# Patient Record
Sex: Male | Born: 1990 | Race: White | Hispanic: No | Marital: Married | Smoking: Never smoker
Health system: Southern US, Community
[De-identification: ages and names within clinical notes are randomized; demographics above are authoritative.]

## PROBLEM LIST (undated history)

## (undated) DIAGNOSIS — M5126 Other intervertebral disc displacement, lumbar region: Secondary | ICD-10-CM

## (undated) HISTORY — PX: TONSILLECTOMY: SUR1361

---

## 2016-08-17 ENCOUNTER — Ambulatory Visit (HOSPITAL_COMMUNITY)
Admission: EM | Admit: 2016-08-17 | Discharge: 2016-08-17 | Disposition: A | Discharge status: Nursing Home (Includes ICF) | Payer: BLUE CROSS/BLUE SHIELD | Source: Home / Self Care | Attending: Family Medicine | Admitting: Family Medicine

## 2016-08-17 ENCOUNTER — Encounter (HOSPITAL_COMMUNITY): Payer: Self-pay | Admitting: Emergency Medicine

## 2016-08-17 ENCOUNTER — Emergency Department (HOSPITAL_COMMUNITY)
Admission: EM | Admit: 2016-08-17 | Discharge: 2016-08-17 | Disposition: A | Discharge status: Home / Self Care | Payer: BLUE CROSS/BLUE SHIELD | Attending: Emergency Medicine | Admitting: Emergency Medicine

## 2016-08-17 DIAGNOSIS — S39012A Strain of muscle, fascia and tendon of lower back, initial encounter: Secondary | ICD-10-CM | POA: Diagnosis not present

## 2016-08-17 DIAGNOSIS — M545 Low back pain: Secondary | ICD-10-CM | POA: Diagnosis present

## 2016-08-17 DIAGNOSIS — M5441 Lumbago with sciatica, right side: Secondary | ICD-10-CM | POA: Insufficient documentation

## 2016-08-17 MED ORDER — HYDROCODONE-ACETAMINOPHEN 5-325 MG PO TABS: 1.0000 | ORAL_TABLET | ORAL | 0 refills | Status: DC | PRN

## 2016-08-17 MED ORDER — ONDANSETRON 4 MG PO TBDP
ORAL_TABLET | ORAL | Status: AC
  Filled 2016-08-17: qty 2

## 2016-08-17 MED ORDER — ONDANSETRON 4 MG PO TBDP
8.0000 mg | ORAL_TABLET | Freq: Once | ORAL | Status: AC
  Administered 2016-08-17: 8 mg via ORAL

## 2016-08-17 MED ORDER — CYCLOBENZAPRINE HCL 5 MG PO TABS: 5.0000 mg | ORAL_TABLET | Freq: Three times a day (TID) | ORAL | 0 refills | Status: DC

## 2016-08-17 MED ORDER — HYDROMORPHONE HCL 1 MG/ML IJ SOLN
2.0000 mg | Freq: Once | INTRAMUSCULAR | Status: AC
  Administered 2016-08-17: 2 mg via INTRAMUSCULAR

## 2016-08-17 MED ORDER — KETOROLAC TROMETHAMINE 30 MG/ML IJ SOLN
30.0000 mg | Freq: Once | INTRAMUSCULAR | Status: AC
  Administered 2016-08-17: 30 mg via INTRAVENOUS
  Filled 2016-08-17: qty 1

## 2016-08-17 MED ORDER — NAPROXEN 500 MG PO TABS: 500.0000 mg | ORAL_TABLET | Freq: Two times a day (BID) | ORAL | 0 refills | Status: DC

## 2016-08-17 MED ORDER — HYDROMORPHONE HCL 1 MG/ML IJ SOLN
INTRAMUSCULAR | Status: AC
  Filled 2016-08-17: qty 2

## 2016-08-17 MED ORDER — METHOCARBAMOL 500 MG PO TABS: 500.0000 mg | ORAL_TABLET | Freq: Two times a day (BID) | ORAL | 0 refills | Status: DC | PRN

## 2016-08-17 NOTE — ED Notes (Signed)
Pt verbalized understanding discharge instructions and denies any further needs or questions at this time. VS stable, ambulatory and steady gait.   

## 2016-08-17 NOTE — ED Provider Notes (Signed)
[facility] Provider Note   CSN: [id number] Arrival date & time: 08/17/16  2151     History   Chief Complaint Chief Complaint  Patient presents with  . Back Pain    HPI Ryan Roberts is a 26 y.o. male.  Ryan Roberts is a 26 y.o. Male who presents to the emergency department complaining of right low back pain worsening today. 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 Patient tells me today his right low back pain has worsened. Patient tells me he bent over and had increase in his pain. This caused him to fall to his knees today. He denies hitting his back. He reports worsening pain with movement and had some pain radiating down his posterior right leg. He was seen in urgent care where he was given Dilaudid. He tells me after being provided with Dilaudid he felt flushed, sweaty and that his back muscles were in spasm. He was sent to the ER. Unsure if this was because they were worried about allergic reaction or because of his back pain. Patient is not sure. He denies any tongue swelling, lip swelling or trouble breathing. He denies any rashes or hives on his body. Currently he tells me his back pain is down to a 4 out of 10 and he no longer feels sweaty or flushed. He denies history of cancer or IV drug use. No rapid changes to his weight. He denies loss of bowel or bladder control. Denies fevers, numbness, weakness, abdominal pain, vomiting, diarrhea, rashes, chest pain, shortness of breath, trouble swallowing, or urinary symptoms.    The history is provided by the patient and medical records. No language interpreter was used.  Back Pain   Pertinent negatives include no chest pain, no fever, no numbness, no headaches, no abdominal pain, no dysuria and no weakness.    History reviewed. No pertinent past medical history.  There are no active problems to display for this patient.   Past Surgical History:    Procedure Laterality Date  . TONSILLECTOMY         Home Medications    Prior to Admission medications   Medication Sig Start Date End Date Taking? Authorizing Provider  HYDROcodone-acetaminophen (NORCO/VICODIN) 5-325 MG tablet Take 1 tablet by mouth every 4 (four) hours as needed. 08/17/16   Linna Hoff, MD  methocarbamol (ROBAXIN) 500 MG tablet Take 1 tablet (500 mg total) by mouth 2 (two) times daily as needed for muscle spasms. 08/17/16   Everlene Farrier, PA-C  naproxen (NAPROSYN) 500 MG tablet Take 1 tablet (500 mg total) by mouth 2 (two) times daily with a meal. 08/17/16   Everlene Farrier, PA-C    Family History No family history on file.  Social History Social History  Substance Use Topics  . Smoking status: Never Smoker  . Smokeless tobacco: Never Used  . Alcohol use No     Allergies   Patient has no known allergies.   Review of Systems Review of Systems  Constitutional: Negative for chills and fever.  HENT: Negative for congestion and sore throat.   Eyes: Negative for visual disturbance.  Respiratory: Negative for cough and shortness of breath.   Cardiovascular: Negative for chest pain.  Gastrointestinal: Negative for abdominal pain, diarrhea, nausea and vomiting.  Genitourinary: Negative for difficulty urinating, dysuria, flank pain, frequency, hematuria, penile pain, testicular pain and urgency.  Musculoskeletal: Positive for back pain. Negative for neck pain.  Skin: Negative for rash and wound.  Neurological: Negative for weakness, numbness and headaches.     Physical Exam Updated Vital Signs BP 133/79   Pulse 92   Temp 98.1 F (36.7 C) (Oral)   Ht 6\' 2"  (1.88 m)   Wt 111.1 kg   SpO2 95%   BMI 31.46 kg/m   Physical Exam  Constitutional: He is oriented to person, place, and time. He appears well-developed and well-nourished. No distress.  Nontoxic appearing.  HENT:  Head: Normocephalic and atraumatic.  Right Ear: External ear normal.  Left  Ear: External ear normal.  Mouth/Throat: Oropharynx is clear and moist.  No tongue or lip swelling. No drooling.  Eyes: Conjunctivae are normal. Pupils are equal, round, and reactive to light. Right eye exhibits no discharge. Left eye exhibits no discharge.  Neck: Neck supple.  Cardiovascular: Normal rate, regular rhythm, normal heart sounds and intact distal pulses.   Bilateral radial, and dorsalis pedis pulses are intact.    Pulmonary/Chest: Effort normal and breath sounds normal. No respiratory distress. He has no wheezes. He has no rales.  Abdominal: Soft. There is no tenderness. There is no guarding.  Musculoskeletal: Normal range of motion. He exhibits tenderness. He exhibits no edema or deformity.  Tenderness to his right low back musculature. No midline neck or back tenderness. No back erythema, deformity, ecchymosis or warmth. Good strength in his bilateral lower extremities.  Lymphadenopathy:    He has no cervical adenopathy.  Neurological: He is alert and oriented to person, place, and time. He displays normal reflexes. No sensory deficit. He exhibits normal muscle tone. Coordination normal.  Normal gait. Bilateral patellar DTRs are intact. Sensation is intact in his bilateral lower extremities.  Skin: Skin is warm and dry. Capillary refill takes less than 2 seconds. No rash noted. He is not diaphoretic. No erythema. No pallor.  Psychiatric: He has a normal mood and affect. His behavior is normal.  Nursing note and vitals reviewed.    ED Treatments / Results  Labs (all labs ordered are listed, but only abnormal results are displayed) Labs Reviewed - No data to display  EKG  EKG Interpretation None       Radiology No results found.  Procedures Procedures (including critical care time)  Medications Ordered in ED Medications  ketorolac (TORADOL) 30 MG/ML injection 30 mg (not administered)     Initial Impression / Assessment and Plan / ED Course  I have reviewed  the triage vital signs and the nursing notes.  Pertinent labs & imaging results that were available during my care of the patient were reviewed by me and considered in my medical decision making (see chart for details).    This  is a 26 y.o. Male who presents to the emergency department complaining of right low back pain worsening today. 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 Patient tells me today his right low back pain has worsened. Patient tells me he bent over and had increase in his pain. This caused him to fall to his knees today. He denies hitting his back. He reports worsening pain with movement and had some pain radiating down his posterior right leg. He was seen in urgent care where he was given Dilaudid. He tells me after being provided with Dilaudid he felt flushed, sweaty and that his back muscles were in spasm. He was sent to the ER. Unsure if this was because  they were worried about allergic reaction or because of his back pain. Patient is not sure. He denies any tongue swelling, lip swelling or trouble breathing. He denies any rashes or hives on his body. Currently he tells me his back pain is down to a 4 out of 10 and he no longer feels sweaty or flushed.  On exam the patient is afebrile nontoxic appearing. No neurological deficits and normal neuro exam.  Normal gait.  No loss of bowel or bladder control.  No concern for cauda equina.  No fever, night sweats, weight loss, h/o cancer, IVDU.  It sounds like patient had a side effect from the Dilaudid this evening. No evidence of allergic reaction. I offer the patient an x-ray of his back and he declines. He feels his back as a muscle spasm. Will provide him with Toradol and discharged with naproxen and Robaxin. I discussed back exercises. Discussed return precautions. I advised the patient to follow-up with their primary care provider this week. I advised the patient to return to  the emergency department with new or worsening symptoms or new concerns. The patient verbalized understanding and agreement with plan.     Final Clinical Impressions(s) / ED Diagnoses   Final diagnoses:  Acute right-sided low back pain with right-sided sciatica    New Prescriptions New Prescriptions   METHOCARBAMOL (ROBAXIN) 500 MG TABLET    Take 1 tablet (500 mg total) by mouth 2 (two) times daily as needed for muscle spasms.   NAPROXEN (NAPROSYN) 500 MG TABLET    Take 1 tablet (500 mg total) by mouth 2 (two) times daily with a meal.     Everlene FarrierWilliam Breyona Swander, PA-C 08/17/16 2325    Arby BarretteMarcy Pfeiffer, MD 08/21/16 [phone]

## 2016-08-17 NOTE — ED Provider Notes (Signed)
[facility]    CSN: [id number] Arrival date & time: 08/17/16  1805     History   Chief Complaint Chief Complaint  Patient presents with  . Back Pain    HPI Ryan Roberts is a 26 y.o. male.   The history is provided by the patient and the spouse.  Back Pain  Location:  Lumbar spine Quality:  Aching Pain severity:  Moderate Onset quality:  Sudden Timing:  Constant Progression:  Worsening Chronicity:  Chronic (chronic L4-5 pain with assoc disc disease, flare today with bending over to pick up wallet, unable to stand) Relieved by:  Nothing Worsened by:  Nothing Ineffective treatments:  None tried Associated symptoms: weakness   Associated symptoms: no abdominal pain, no abdominal swelling, no bladder incontinence, no bowel incontinence, no fever, no numbness, no pelvic pain, no perianal numbness and no tingling   Risk factors: lack of exercise     History reviewed. No pertinent past medical history.  There are no active problems to display for this patient.   Past Surgical History:  Procedure Laterality Date  . TONSILLECTOMY         Home Medications    Prior to Admission medications   Medication Sig Start Date End Date Taking? Authorizing Provider  HYDROcodone-acetaminophen (NORCO/VICODIN) 5-325 MG tablet Take 1 tablet by mouth every 4 (four) hours as needed. 08/17/16   Linna HoffJames D Megahn Killings, MD  methocarbamol (ROBAXIN) 500 MG tablet Take 1 tablet (500 mg total) by mouth 2 (two) times daily as needed for muscle spasms. 08/17/16   Everlene FarrierWilliam Dansie, PA-C  naproxen (NAPROSYN) 500 MG tablet Take 1 tablet (500 mg total) by mouth 2 (two) times daily with a meal. 08/17/16   Everlene FarrierWilliam Dansie, PA-C    Family History No family history on file.  Social History Social History  Substance Use Topics  . Smoking status: Never Smoker  . Smokeless tobacco: Never Used  . Alcohol use No     Allergies   Patient has no known allergies.   Review of Systems Review of  Systems  Constitutional: Negative for fever.  HENT: Negative.   Gastrointestinal: Negative.  Negative for abdominal pain and bowel incontinence.  Genitourinary: Negative.  Negative for bladder incontinence and pelvic pain.  Musculoskeletal: Positive for back pain and gait problem.  Neurological: Positive for weakness. Negative for tingling and numbness.  All other systems reviewed and are negative.    Physical Exam Triage Vital Signs ED Triage Vitals  Enc Vitals Group     BP 08/17/16 1846 130/82     Pulse Rate 08/17/16 1846 100     Resp 08/17/16 1846 16     Temp 08/17/16 1846 99.1 F (37.3 C)     Temp Source 08/17/16 1846 Oral     SpO2 08/17/16 1846 97 %     Weight 08/17/16 1847 245 lb (111.1 kg)     Height 08/17/16 1847 6\' 2"  (1.88 m)     Head Circumference --      Peak Flow --      Pain Score 08/17/16 1848 7     Pain Loc --      Pain Edu? --      Excl. in GC? --    No data found.   Updated Vital Signs BP 130/82   Pulse 100   Temp 99.1 F (37.3 C) (Oral)   Resp 16   Ht 6\' 2"  (1.88 m)   Wt 245 lb (111.1 kg)   SpO2 97%  BMI 31.46 kg/m   Visual Acuity Right Eye Distance:   Left Eye Distance:   Bilateral Distance:    Right Eye Near:   Left Eye Near:    Bilateral Near:     Physical Exam  Constitutional: He is oriented to person, place, and time. He appears well-developed and well-nourished. He appears distressed.  Musculoskeletal: He exhibits tenderness.       Lumbar back: He exhibits decreased range of motion, tenderness, bony tenderness, pain and spasm. He exhibits normal pulse.       Back:  Neurological: He is alert and oriented to person, place, and time.  Skin: Skin is warm and dry.  Nursing note and vitals reviewed.    UC Treatments / Results  Labs (all labs ordered are listed, but only abnormal results are displayed) Labs Reviewed - No data to display  EKG  EKG Interpretation None       Radiology No results  found.  Procedures Procedures (including critical care time)  Medications Ordered in UC Medications  HYDROmorphone (DILAUDID) injection 2 mg (2 mg Intramuscular Given 08/17/16 2108)  ondansetron (ZOFRAN-ODT) disintegrating tablet 8 mg (8 mg Oral Given 08/17/16 2108)     Initial Impression / Assessment and Plan / UC Course  I have reviewed the triage vital signs and the nursing notes.  Pertinent labs & imaging results that were available during my care of the patient were reviewed by me and considered in my medical decision making (see chart for details).       Final Clinical Impressions(s) / UC Diagnoses   Final diagnoses:  Strain of lumbar region, initial encounter    New Prescriptions Discharge Medication List as of 08/17/2016  9:05 PM    START taking these medications   Details  HYDROcodone-acetaminophen (NORCO/VICODIN) 5-325 MG tablet Take 1 tablet by mouth every 4 (four) hours as needed., Starting Sat 08/17/2016, Print    cyclobenzaprine (FLEXERIL) 5 MG tablet Take 1 tablet (5 mg total) by mouth 3 (three) times daily., Starting Sat 08/17/2016, Print         Linna Hoff, MD 08/20/16 1013

## 2016-08-17 NOTE — Discharge Instructions (Signed)
Bedrest, pain medicine as needed, see orthopedist next week

## 2016-08-17 NOTE — ED Triage Notes (Signed)
[facility] reports bending over this am to pick up something and reports lower back pain ever since. Hx herniated L4,L5. [facility] zofran PO, [facility] Pt became diaphoretic and pale after receiving Dilaudid. Pain currently 4/10.

## 2016-08-17 NOTE — ED Notes (Signed)
Pt had some diaphoresis and dizziness after pain medication. Pale, and BP dropped. IV started and pt sent to ED for further eval.

## 2016-08-17 NOTE — ED Triage Notes (Signed)
PT has known herniated L4 and L5 disks. PT began having shooting pain down left leg today at 11. PT fell in the shower at 1pm. PT reports no additional injury from fall.

## 2017-12-24 DIAGNOSIS — H81399 Other peripheral vertigo, unspecified ear: Secondary | ICD-10-CM | POA: Diagnosis not present

## 2017-12-24 DIAGNOSIS — M545 Low back pain: Secondary | ICD-10-CM | POA: Diagnosis not present

## 2018-02-02 DIAGNOSIS — S0501XA Injury of conjunctiva and corneal abrasion without foreign body, right eye, initial encounter: Secondary | ICD-10-CM | POA: Diagnosis not present

## 2018-02-05 DIAGNOSIS — S0501XD Injury of conjunctiva and corneal abrasion without foreign body, right eye, subsequent encounter: Secondary | ICD-10-CM | POA: Diagnosis not present

## 2018-04-16 DIAGNOSIS — M5126 Other intervertebral disc displacement, lumbar region: Secondary | ICD-10-CM | POA: Diagnosis not present

## 2018-04-27 DIAGNOSIS — M5126 Other intervertebral disc displacement, lumbar region: Secondary | ICD-10-CM | POA: Diagnosis not present

## 2018-05-07 DIAGNOSIS — M5126 Other intervertebral disc displacement, lumbar region: Secondary | ICD-10-CM | POA: Diagnosis not present

## 2018-05-19 DIAGNOSIS — M5416 Radiculopathy, lumbar region: Secondary | ICD-10-CM | POA: Diagnosis not present

## 2018-05-22 ENCOUNTER — Emergency Department: Payer: BLUE CROSS/BLUE SHIELD

## 2018-05-22 ENCOUNTER — Encounter: Payer: Self-pay | Admitting: Emergency Medicine

## 2018-05-22 ENCOUNTER — Emergency Department
Admission: EM | Admit: 2018-05-22 | Discharge: 2018-05-22 | Disposition: A | Discharge status: Home / Self Care | Payer: BLUE CROSS/BLUE SHIELD | Attending: Emergency Medicine | Admitting: Emergency Medicine

## 2018-05-22 ENCOUNTER — Other Ambulatory Visit: Payer: Self-pay

## 2018-05-22 DIAGNOSIS — M7918 Myalgia, other site: Secondary | ICD-10-CM

## 2018-05-22 DIAGNOSIS — M542 Cervicalgia: Secondary | ICD-10-CM | POA: Diagnosis not present

## 2018-05-22 DIAGNOSIS — S199XXA Unspecified injury of neck, initial encounter: Secondary | ICD-10-CM | POA: Diagnosis not present

## 2018-05-22 DIAGNOSIS — S3992XA Unspecified injury of lower back, initial encounter: Secondary | ICD-10-CM | POA: Diagnosis not present

## 2018-05-22 DIAGNOSIS — S40022A Contusion of left upper arm, initial encounter: Secondary | ICD-10-CM | POA: Diagnosis not present

## 2018-05-22 DIAGNOSIS — G44309 Post-traumatic headache, unspecified, not intractable: Secondary | ICD-10-CM | POA: Diagnosis not present

## 2018-05-22 DIAGNOSIS — M549 Dorsalgia, unspecified: Secondary | ICD-10-CM | POA: Diagnosis not present

## 2018-05-22 DIAGNOSIS — R51 Headache: Secondary | ICD-10-CM | POA: Insufficient documentation

## 2018-05-22 DIAGNOSIS — Z79899 Other long term (current) drug therapy: Secondary | ICD-10-CM | POA: Diagnosis not present

## 2018-05-22 DIAGNOSIS — S0990XA Unspecified injury of head, initial encounter: Secondary | ICD-10-CM | POA: Diagnosis not present

## 2018-05-22 HISTORY — DX: Other intervertebral disc displacement, lumbar region: M51.26

## 2018-05-22 MED ORDER — KETOROLAC TROMETHAMINE 10 MG PO TABS: 10.0000 mg | ORAL_TABLET | Freq: Four times a day (QID) | ORAL | 0 refills | Status: DC | PRN

## 2018-05-22 MED ORDER — KETOROLAC TROMETHAMINE 60 MG/2ML IM SOLN
60.0000 mg | Freq: Once | INTRAMUSCULAR | Status: AC
  Administered 2018-05-22: 60 mg via INTRAMUSCULAR
  Filled 2018-05-22: qty 2

## 2018-05-22 MED ORDER — CYCLOBENZAPRINE HCL 10 MG PO TABS: 10.0000 mg | ORAL_TABLET | Freq: Three times a day (TID) | ORAL | 0 refills | Status: DC | PRN

## 2018-05-22 MED ORDER — CYCLOBENZAPRINE HCL 10 MG PO TABS: 10.0000 mg | ORAL_TABLET | Freq: Once | ORAL | Status: DC

## 2018-05-22 MED ORDER — ONDANSETRON 8 MG PO TBDP
8.0000 mg | ORAL_TABLET | Freq: Once | ORAL | Status: AC
  Administered 2018-05-22: 8 mg via ORAL
  Filled 2018-05-22: qty 1
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
## 2018-05-22 NOTE — Discharge Instructions (Signed)
Follow discharge care instruction take medication as directed. °
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
## 2018-05-22 NOTE — ED Notes (Signed)
c-collar placed in triage

## 2018-06-12 DIAGNOSIS — M5416 Radiculopathy, lumbar region: Secondary | ICD-10-CM | POA: Diagnosis not present

## 2018-08-18 DIAGNOSIS — M5416 Radiculopathy, lumbar region: Secondary | ICD-10-CM | POA: Diagnosis not present

## 2018-08-28 DIAGNOSIS — J111 Influenza due to unidentified influenza virus with other respiratory manifestations: Secondary | ICD-10-CM | POA: Diagnosis not present

## 2019-03-30 DIAGNOSIS — M9905 Segmental and somatic dysfunction of pelvic region: Secondary | ICD-10-CM | POA: Diagnosis not present

## 2019-03-30 DIAGNOSIS — M62830 Muscle spasm of back: Secondary | ICD-10-CM | POA: Diagnosis not present

## 2019-03-30 DIAGNOSIS — M5136 Other intervertebral disc degeneration, lumbar region: Secondary | ICD-10-CM | POA: Diagnosis not present

## 2019-03-30 DIAGNOSIS — M9903 Segmental and somatic dysfunction of lumbar region: Secondary | ICD-10-CM | POA: Diagnosis not present

## 2019-04-12 DIAGNOSIS — M5416 Radiculopathy, lumbar region: Secondary | ICD-10-CM | POA: Diagnosis not present

## 2019-04-23 DIAGNOSIS — M5416 Radiculopathy, lumbar region: Secondary | ICD-10-CM | POA: Diagnosis not present
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
## 2019-09-24 ENCOUNTER — Other Ambulatory Visit: Payer: Self-pay

## 2019-09-24 ENCOUNTER — Ambulatory Visit: Payer: BC Managed Care – PPO | Admitting: Internal Medicine

## 2019-09-24 ENCOUNTER — Encounter: Payer: Self-pay | Admitting: Internal Medicine

## 2019-09-24 VITALS — BP 116/80 | HR 85 | Temp 97.1°F | Resp 16 | Ht 73.0 in | Wt 229.5 lb

## 2019-09-24 DIAGNOSIS — E6609 Other obesity due to excess calories: Secondary | ICD-10-CM

## 2019-09-24 DIAGNOSIS — Z13220 Encounter for screening for lipoid disorders: Secondary | ICD-10-CM

## 2019-09-24 DIAGNOSIS — F341 Dysthymic disorder: Secondary | ICD-10-CM | POA: Insufficient documentation

## 2019-09-24 DIAGNOSIS — Z6830 Body mass index (BMI) 30.0-30.9, adult: Secondary | ICD-10-CM

## 2019-09-24 DIAGNOSIS — L989 Disorder of the skin and subcutaneous tissue, unspecified: Secondary | ICD-10-CM | POA: Diagnosis not present

## 2019-09-24 DIAGNOSIS — R41840 Attention and concentration deficit: Secondary | ICD-10-CM

## 2019-09-24 DIAGNOSIS — M519 Unspecified thoracic, thoracolumbar and lumbosacral intervertebral disc disorder: Secondary | ICD-10-CM | POA: Diagnosis not present

## 2019-09-24 DIAGNOSIS — Z791 Long term (current) use of non-steroidal anti-inflammatories (NSAID): Secondary | ICD-10-CM

## 2019-09-24 DIAGNOSIS — Z5181 Encounter for therapeutic drug level monitoring: Secondary | ICD-10-CM | POA: Insufficient documentation

## 2019-09-24 MED ORDER — ATOMOXETINE HCL 40 MG PO CAPS: 40.0000 mg | ORAL_CAPSULE | Freq: Every day | ORAL | 2 refills | Status: DC

## 2019-09-25 LAB — COMPLETE METABOLIC PANEL WITH GFR
AG Ratio: 1.9 (calc) (ref 1.0–2.5)
ALT: 22 U/L (ref 9–46)
AST: 19 U/L (ref 10–40)
Albumin: 4.6 g/dL (ref 3.6–5.1)
Alkaline phosphatase (APISO): 63 U/L (ref 36–130)
BUN: 20 mg/dL (ref 7–25)
CO2: 28 mmol/L (ref 20–32)
Calcium: 9.8 mg/dL (ref 8.6–10.3)
Chloride: 106 mmol/L (ref 98–110)
Creat: 1.02 mg/dL (ref 0.60–1.35)
GFR, Est African American: 114 mL/min/{1.73_m2} (ref >=60)
GFR, Est Non African American: 98 mL/min/{1.73_m2} (ref >=60)
Globulin: 2.4 g/dL (calc) (ref 1.9–3.7)
Glucose, Bld: 90 mg/dL (ref 65–99)
Potassium: 4.7 mmol/L (ref 3.5–5.3)
Sodium: 140 mmol/L (ref 135–146)
Total Bilirubin: 0.7 mg/dL (ref 0.2–1.2)
Total Protein: 7 g/dL (ref 6.1–8.1)

## 2019-09-25 LAB — CBC WITH DIFFERENTIAL/PLATELET
Absolute Monocytes: 536 cells/uL (ref 200–950)
Basophils Absolute: 31 cells/uL (ref 0–200)
Basophils Relative: 0.6 %
Eosinophils Absolute: 130 cells/uL (ref 15–500)
Eosinophils Relative: 2.5 %
HCT: 47.8 % (ref 38.5–50.0)
Hemoglobin: 16.3 g/dL (ref 13.2–17.1)
Lymphs Abs: 1591 cells/uL (ref 850–3900)
MCH: 31 pg (ref 27.0–33.0)
MCHC: 34.1 g/dL (ref 32.0–36.0)
MCV: 90.9 fL (ref 80.0–100.0)
MPV: 11.5 fL (ref 7.5–12.5)
Monocytes Relative: 10.3 %
Neutro Abs: 2912 cells/uL (ref 1500–7800)
Neutrophils Relative %: 56 %
Platelets: 234 10*3/uL (ref 140–400)
RBC: 5.26 10*6/uL (ref 4.20–5.80)
RDW: 11.8 % (ref 11.0–15.0)
Total Lymphocyte: 30.6 %
WBC: 5.2 10*3/uL (ref 3.8–10.8)

## 2019-09-25 LAB — LIPID PANEL
Cholesterol: 194 mg/dL (ref <200)
HDL: 46 mg/dL (ref >=40)
LDL Cholesterol (Calc): 121 mg/dL (calc) — ABNORMAL HIGH
Non-HDL Cholesterol (Calc): 148 mg/dL (calc) — ABNORMAL HIGH (ref <130)
Total CHOL/HDL Ratio: 4.2 (calc) (ref <5.0)
Triglycerides: 159 mg/dL — ABNORMAL HIGH (ref <150)

## 2019-09-25 LAB — TSH: TSH: 1.95 mIU/L (ref 0.40–4.50)

## 2019-09-27 ENCOUNTER — Telehealth: Payer: Self-pay

## 2019-09-27 NOTE — Telephone Encounter (Signed)
Informed patient that we did not contact him, I did let him know that I saw a note in Dr. Dorris Fetch recent labs that stated that a CMA from his office tried reaching out to patient. Told patient to contact the internal medicine office. KW

## 2019-09-27 NOTE — Telephone Encounter (Signed)
Could this be one of the patients that has the insurance assigned to the wrong provider?   Copied from CRM [id number]. Topic: General - Call Back - No Documentation >> Sep 27, 2019  9:51 AM Baldo Daub L wrote: Reason for CRM:  Pt states that he received a call from the office, but no message was left.  Pt calling to see what is going on.

## 2019-11-18 NOTE — Progress Notes (Signed)
Patient ID: Ryan Roberts, male    DOB: [birth year], 29 y.o.   MRN: [id number]  PCP: Towanda Malkin, MD  Chief Complaint  Patient presents with  . Follow-up  . ADHD    Subjective:   Ryan Roberts is a 29 y.o. male, presents to clinic with CC of the following:  Chief Complaint  Patient presents with  . Follow-up  . ADHD    HPI:  Patient is a 29 year old male who presented to establish care approximately 8 weeks ago. He returns today for follow-up   Obesity  He has done extremely well losing weight in the past couple months, as after he got worried that his lipid panel was abnormal, that served as increased motivation.  His wife and him researched recipes, and I really watched her fats in her diet, and with continued regular exercise, he has been very successful in losing weight. He inquired about a number for a reasonable weight goal, and agreed it is about where he is at presently, with a 200 to 210 pound range felt reasonable. Wt Readings from Last 3 Encounters:  11/19/19 210 lb (95.3 kg)  09/24/19 229 lb 8 oz (104.1 kg)  05/22/18 185 lb (83.9 kg)   The patient noted last visit that his highest weight was approximately 250.  Hyperlipidemia Lab Results  Component Value Date   CHOL 194 09/24/2019   HDL 46 09/24/2019   LDLCALC 121 (H) 09/24/2019   TRIG 159 (H) 09/24/2019   CHOLHDL 4.2 09/24/2019   He has done a great job modifying his diet recently and discussed can recheck again in approximately 6 months on follow-up, and no need to discuss any medication addition at this point.  Attention deficit Clinically, did have elements of ADHD concern at last visit and discussed at length the need for testing to help diagnose, and the stimulant medicines that are often used for this and the concerns with these medicines for side effects, and also being tightly regulated as our controlled substances.  He was very understanding of this, and was not anxious to  try stimulant medicines. Noted the medicine Strattera which is often tried, a nonstimulant medicine and he was in agreement with trying this presently. Medication regimen - Strattera 40 mg daily, and he did not increase it to 80 mg daily as noted he could after about 4 weeks if felt it was not helping as much. 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   Lumbar disc disease In the past has followed with Dr. Wynn Banker, and he has had periodic injections when needed to help. Also manages by taking a couple of Aleve a day as needed Discussed with his chronic NSAID use, the importance of checking kidney function periodically, and did last visit and was okay. Lab Results  Component Value Date   CREATININE 1.02 09/24/2019   BUN 20 09/24/2019   NA 140 09/24/2019   K 4.7 09/24/2019   CL 106 09/24/2019   CO2 28 09/24/2019    Dysthymia reviewed his PHQ-9 again today.  He notes he is feeling much better with respect to any depression concerns, thanks a lot is related to being much more productive and better focused.  Covid vaccine -has not yet obtained,  although [employer] is mandating their students get the vaccine and he will.  Patient Active Problem List   Diagnosis Date Noted  . Class 1 obesity due to excess calories without serious comorbidity with body mass index (BMI) of 30.0 to 30.9 in adult 09/24/2019  . Attention deficit 09/24/2019  . Lumbar disc disease 09/24/2019  . Encounter for monitoring chronic NSAID therapy 09/24/2019  . Dysthymia 09/24/2019      Current Outpatient Medications:  .  atomoxetine (STRATTERA) 40 MG capsule, Take 1 capsule (40 mg total) by mouth daily., Disp: 30 capsule, Rfl: 2 .  naproxen sodium (ALEVE) 220 MG tablet, Take 220 mg by mouth as needed., Disp: , Rfl:     No Known Allergies   Past Surgical History:  Procedure Laterality Date  . TONSILLECTOMY       Family History  Problem Relation Age of Onset  . Diabetes Mother   . Cancer Father   . ADD / ADHD Father   . Colon cancer Maternal Grandmother   . Heart attack Maternal Grandfather   . Diabetes Maternal Grandfather   . Cancer Paternal Grandmother   . Lung cancer Paternal Grandfather      Social History   Tobacco Use  . Smoking status: Never Smoker  . Smokeless tobacco: Never Used  Substance Use Topics  . Alcohol use: No    With staff assistance, above reviewed with the patient today.  ROS: As per HPI, otherwise no specific complaints on a limited and focused system review   No results found for this or any previous visit (from the past 72 hour(s)).   PHQ2/9: Depression screen [facility] 2/9 11/19/2019 09/24/2019  Decreased Interest 0 0  Down, Depressed, Hopeless 0 1  PHQ - 2 Score 0 1  Altered sleeping 0 1  Tired, decreased energy 0 0  Change in appetite 0 0  Feeling bad or failure about yourself  0 0  Trouble concentrating 1 3  Moving slowly or fidgety/restless 0 1  Suicidal thoughts 0 -  PHQ-9 Score 1 6  Difficult doing work/chores Not difficult at all Very difficult   PHQ-2/9 Result is neg  Fall Risk: Fall Risk  11/19/2019 09/24/2019  Falls in the past year? 0 0  Number falls in past yr: 0 0  Injury with Fall? 0 0      Objective:   Vitals:   11/19/19 0902  BP: 130/72  Pulse: 96  Resp: 16  Temp: 98.1 F (36.7 C)  TempSrc: Temporal  SpO2: 100%  Weight: 210 lb (95.3 kg)  Height: 6\' 1"  (1.854 m)    Body mass index is 27.71 kg/m.  Physical Exam   NAD, masked, very pleasant, looks well HEENT - [place]/AT, sclera anicteric, PERRL,  conj - non-inj'ed,  pharynx clear Neck - supple, no adenopathy,  Car - RRR without m/g/r Pulm- RR and effort normal at rest, CTA without wheeze or rales Abd - soft, NT diffusely, Back - no CVA tenderness Ext - no LE edema,   Neuro/psychiatric - affect was not flat, appropriate with conversation  Alert and oriented  Grossly non-focal   Speech and gait are normal   Results for orders placed or performed in visit on 09/24/19  COMPLETE METABOLIC PANEL WITH GFR  Result Value Ref Range   Glucose, Bld 90 65 - 99 mg/dL   BUN 20 7 - 25 mg/dL   Creat 09/26/19 8.84 - 1.66 mg/dL   GFR, Est Non African American 98 > OR = 60  mL/min/1.37m2   GFR, Est African American 114 > OR = 60 mL/min/1.50m2   BUN/Creatinine Ratio NOT APPLICABLE 6 - 22 (calc)   Sodium 140 135 - 146 mmol/L   Potassium 4.7 3.5 - 5.3 mmol/L   Chloride 106 98 - 110 mmol/L   CO2 28 20 - 32 mmol/L   Calcium 9.8 8.6 - 10.3 mg/dL   Total Protein 7.0 6.1 - 8.1 g/dL   Albumin 4.6 3.6 - 5.1 g/dL   Globulin 2.4 1.9 - 3.7 g/dL (calc)   AG Ratio 1.9 1.0 - 2.5 (calc)   Total Bilirubin 0.7 0.2 - 1.2 mg/dL   Alkaline phosphatase (APISO) 63 36 - 130 U/L   AST 19 10 - 40 U/L   ALT 22 9 - 46 U/L  CBC with Differential/Platelet  Result Value Ref Range   WBC 5.2 3.8 - 10.8 Thousand/uL   RBC 5.26 4.20 - 5.80 Million/uL   Hemoglobin 16.3 13.2 - 17.1 g/dL   HCT 98.3 38.2 - 50.5 %   MCV 90.9 80.0 - 100.0 fL   MCH 31.0 27.0 - 33.0 pg   MCHC 34.1 32.0 - 36.0 g/dL   RDW 39.7 67.3 - 41.9 %   Platelets 234 140 - 400 Thousand/uL   MPV 11.5 7.5 - 12.5 fL   Neutro Abs 2,912 1,500 - 7,800 cells/uL   Lymphs Abs 1,591 850 - 3,900 cells/uL   Absolute Monocytes 536 200 - 950 cells/uL   Eosinophils Absolute 130 15 - 500 cells/uL   Basophils Absolute 31 0 - 200 cells/uL   Neutrophils Relative % 56 %   Total Lymphocyte 30.6 %   Monocytes Relative 10.3 %   Eosinophils Relative 2.5 %   Basophils Relative 0.6 %  TSH  Result Value Ref Range   TSH 1.95 0.40 - 4.50 mIU/L  Lipid panel  Result Value Ref Range   Cholesterol 194 <200 mg/dL   HDL 46 > OR = 40 mg/dL   Triglycerides 379 (H) <150 mg/dL   LDL Cholesterol (Calc) 121 (H) mg/dL (calc)   Total CHOL/HDL Ratio 4.2 <5.0  (calc)   Non-HDL Cholesterol (Calc) 148 (H) <130 mg/dL (calc)       Assessment & Plan:   1. Attention deficit Has done very well with the addition of Strattera. Will increase the dose to 80 mg as he would like to do so, and refilled the medicine today with the 80 mg capsule to just take 1 daily in the future. Continue to monitor for side effects, and he noted he will. - atomoxetine (STRATTERA) 80 MG capsule; Take 1 capsule (80 mg total) by mouth daily.  Dispense: 90 capsule; Refill: 1  2. Mixed hyperlipidemia Has changed his diet significantly to help with management, and also has helped him lose  weight as a result. Can recheck labs again in about 6 months to reassess.  3. Overweight Has had success with weight loss, and congratulated on that.  Continuing with the dietary modifications and staying active with regular exercise as he does is important for weight management, and continue to monitor.  4. Lumbar disc disease 5. Encounter for monitoring chronic NSAID therapy On last labs, kidney function was good Continuing with regular exercise also important to help with his back and he plans to continue with that  6. Dysthymia Symptoms much improved on this visit, with no real concerns for depression or dysthymia presently    Will schedule a follow-up in about 6 months time, follow-up sooner as needed.  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   Jamelle Haring, MD 11/19/19 9:22 AM

## 2019-11-19 ENCOUNTER — Ambulatory Visit: Payer: BC Managed Care – PPO | Admitting: Internal Medicine

## 2019-11-19 ENCOUNTER — Other Ambulatory Visit: Payer: Self-pay

## 2019-11-19 ENCOUNTER — Encounter: Payer: Self-pay | Admitting: Internal Medicine

## 2019-11-19 VITALS — BP 130/72 | HR 96 | Temp 98.1°F | Resp 16 | Ht 73.0 in | Wt 210.0 lb

## 2019-11-19 DIAGNOSIS — R41840 Attention and concentration deficit: Secondary | ICD-10-CM

## 2019-11-19 DIAGNOSIS — E782 Mixed hyperlipidemia: Secondary | ICD-10-CM | POA: Diagnosis not present

## 2019-11-19 DIAGNOSIS — Z5181 Encounter for therapeutic drug level monitoring: Secondary | ICD-10-CM

## 2019-11-19 DIAGNOSIS — Z791 Long term (current) use of non-steroidal anti-inflammatories (NSAID): Secondary | ICD-10-CM

## 2019-11-19 DIAGNOSIS — M519 Unspecified thoracic, thoracolumbar and lumbosacral intervertebral disc disorder: Secondary | ICD-10-CM

## 2019-11-19 DIAGNOSIS — E663 Overweight: Secondary | ICD-10-CM | POA: Diagnosis not present

## 2019-11-19 DIAGNOSIS — F341 Dysthymic disorder: Secondary | ICD-10-CM

## 2019-11-19 MED ORDER — ATOMOXETINE HCL 80 MG PO CAPS
80.0000 mg | ORAL_CAPSULE | Freq: Every day | ORAL | 1 refills | Status: DC
Start: 2019-11-19 — End: 2020-05-16

## 2019-11-19 NOTE — Patient Instructions (Signed)

## 2020-05-16 ENCOUNTER — Other Ambulatory Visit: Payer: Self-pay | Admitting: Internal Medicine

## 2020-05-16 DIAGNOSIS — R41840 Attention and concentration deficit: Secondary | ICD-10-CM

## 2020-05-16 NOTE — Progress Notes (Signed)
Patient ID: Ryan Roberts, male    DOB: 03/24/1991, 29 y.o.   MRN: [id number]  PCP: Jamelle Haring, MD  Chief Complaint  Patient presents with  . Results    Follow up on labs  . Arm Problem    left    Subjective:   Ryan Roberts is a 29 y.o. male, presents to clinic with CC of the following:  Chief Complaint  Patient presents with  . Results    Follow up on labs  . Arm Problem    left    HPI:  Patient is a 29 year old male  Last visit with me was 11/19/2019 He returns today for follow-up  Hurt left forearm a couple weeks, getting ready for bed and noted pain beneath the elbow.  Has been going to the gym and doing a lot of lifting activities routinely. Iced, took ibuprofen prn, uses a brace with activities and stopped doing curls at gym.  Was hurting intermittently, and has slowly improved. Not hurting at all presently.  Couple months ago, he had a similar strain in his upper right back with lifting activities, and the upper infrascapular edge trap region, rested from lifting, apply ice topically, and that slowly improved, and he has gotten back to doing some lifting activities slowly without recurrence of pain symptoms.  Obesity  He has continued to do extremely well losing weight  His wife and him researched recipes, and really watched fats in the diet, and with continued regular exercise, he has continued to be very successful in losing weight. Wt Readings from Last 3 Encounters:  05/17/20 197 lb 14.4 oz (89.8 kg)  11/19/19 210 lb (95.3 kg)  09/24/19 229 lb 8 oz (104.1 kg)   The patient noted last visit that his highest weight was approximately 250. He notes he feels good at his current weight.  Hyperlipidemia Medication regimen-none Lab Results  Component Value Date   CHOL 194 09/24/2019   HDL 46 09/24/2019   LDLCALC 121 (H) 09/24/2019   TRIG 159 (H) 09/24/2019   CHOLHDL 4.2 09/24/2019   He has done a great job modifying his diet to  help since these labs in March were obtained.   We will recheck today.  Attention deficit Medication regimen-Strattera 80 mg daily Clinically, did have elements of ADHD concern on initial visit anddiscussed at length the need for testing to help diagnose, and the stimulant medicines that are often used for this and the concerns with these medicines for side effects, and also being tightly regulated as our controlled substances. He was very understanding of this, and was not anxious to try stimulant medicines.Noted the medicine Strattera which is often tried, a nonstimulant medicine and he was in agreement with trying this, and has noticed success taking the Strattera 80 mg daily. 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  Dysthymia reviewed his PHQ today which was good. He notes he continues feeling much better without major depression concerns presently  Covid vaccine - completed   Patient Active Problem List   Diagnosis Date Noted  . Overweight 11/19/2019  . Mixed hyperlipidemia 11/19/2019  . Attention deficit 09/24/2019  . Lumbar disc disease 09/24/2019  . Encounter for monitoring chronic NSAID therapy 09/24/2019  . Dysthymia 09/24/2019      Current Outpatient Medications:  .  atomoxetine (STRATTERA)  80 MG capsule, TAKE 1 CAPSULE(80 MG) BY MOUTH DAILY, Disp: 90 capsule, Rfl: 1 .  naproxen sodium (ALEVE) 220 MG tablet, Take 220 mg by mouth as needed., Disp: , Rfl:    No Known Allergies   Past Surgical History:  Procedure Laterality Date  . TONSILLECTOMY       Family History  Problem Relation Age of Onset  . Diabetes Mother   . Cancer Father   . ADD / ADHD Father   . Colon cancer Maternal Grandmother   . Heart attack Maternal Grandfather   . Diabetes Maternal Grandfather   . Cancer Paternal Grandmother   . Lung cancer  Paternal Grandfather      Social History   Tobacco Use  . Smoking status: Never Smoker  . Smokeless tobacco: Never Used  Substance Use Topics  . Alcohol use: No    With staff assistance, above reviewed with the patient today.  ROS: As per HPI, otherwise no specific complaints on a limited and focused system review   No results found for this or any previous visit (from the past 72 hour(s)).   PHQ2/9: Depression screen [facility] 05/17/2020 11/19/2019 09/24/2019  Decreased Interest 0 0 0  Down, Depressed, Hopeless 0 0 1  PHQ - 2 Score 0 0 1  Altered sleeping - 0 1  Tired, decreased energy - 0 0  Change in appetite - 0 0  Feeling bad or failure about yourself  - 0 0  Trouble concentrating - 1 3  Moving slowly or fidgety/restless - 0 1  Suicidal thoughts - 0 -  PHQ-9 Score - 1 6  Difficult doing work/chores - Not difficult at all Very difficult   PHQ-2/9 Result is neg  Fall Risk: Fall Risk  05/17/2020 11/19/2019 09/24/2019  Falls in the past year? 0 0 0  Number falls in past yr: 0 0 0  Injury with Fall? 0 0 0      Objective:   Vitals:   05/17/20 0808  BP: 120/90  Pulse: 89  Resp: 16  Temp: 98.1 F (36.7 C)  TempSrc: Oral  SpO2: 100%  Weight: 197 lb 14.4 oz (89.8 kg)  Height: 6\' 1"  (1.854 m)    Body mass index is 26.11 kg/m. Recheck blood pressure was 132/88  Physical Exam   NAD, masked, very pleasant, looks well HEENT - [place]/AT, sclera anicteric, PERRL,  conj - non-inj'ed,  pharynx clear Neck - supple, no adenopathy,  Car - RRR without m/g/r Pulm- RR and effort normal at rest, CTA without wheeze or rales Abd - soft, NT diffusely, Back - no CVA tenderness, nontender palpating the right infrascapular and trap muscle region where he felt discomfort previously.  Good abduction and forward elevation of the right upper extremity without pain.  Apley scratch test from above without pain. Ext - no LE edema, nontender palpating the left forearm proximal on the flexor  side distal to the elbow where he fe felt the intermittent discomfort recently, no swelling or bruising, no divot, good strength with wrist flexion and extension against resistance as well as supination and pronation against resistance, no pain with testing, good strength with biceps curl, no pain doing a bicep curl, elbow with good range of motion and wrist with good range of motion.  Good grip strength, incision intact to light touch in the left upper extremity.  Pulses good distally. Neuro/psychiatric - affect was not flat, appropriate with conversation  Alert and oriented             Grossly non-focal              Speech normal  Results for orders placed or performed in visit on 09/24/19  COMPLETE METABOLIC PANEL WITH GFR  Result Value Ref Range   Glucose, Bld 90 65 - 99 mg/dL   BUN 20 7 - 25 mg/dL   Creat 8.36 6.29 - 4.76 mg/dL   GFR, Est Non African American 98 > OR = 60 mL/min/1.44m2   GFR, Est African American 114 > OR = 60 mL/min/1.22m2   BUN/Creatinine Ratio NOT APPLICABLE 6 - 22 (calc)   Sodium 140 135 - 146 mmol/L   Potassium 4.7 3.5 - 5.3 mmol/L   Chloride 106 98 - 110 mmol/L   CO2 28 20 - 32 mmol/L   Calcium 9.8 8.6 - 10.3 mg/dL   Total Protein 7.0 6.1 - 8.1 g/dL   Albumin 4.6 3.6 - 5.1 g/dL   Globulin 2.4 1.9 - 3.7 g/dL (calc)   AG Ratio 1.9 1.0 - 2.5 (calc)   Total Bilirubin 0.7 0.2 - 1.2 mg/dL   Alkaline phosphatase (APISO) 63 36 - 130 U/L   AST 19 10 - 40 U/L   ALT 22 9 - 46 U/L  CBC with Differential/Platelet  Result Value Ref Range   WBC 5.2 3.8 - 10.8 Thousand/uL   RBC 5.26 4.20 - 5.80 Million/uL   Hemoglobin 16.3 13.2 - 17.1 g/dL   HCT 54.6 38 - 50 %   MCV 90.9 80.0 - 100.0 fL   MCH 31.0 27.0 - 33.0 pg   MCHC 34.1 32.0 - 36.0 g/dL   RDW 50.3 54.6 - 56.8 %   Platelets 234 140 - 400 Thousand/uL   MPV 11.5 7.5 - 12.5 fL   Neutro Abs 2,912 1,500 - 7,800 cells/uL   Lymphs Abs 1,591 850 - 3,900 cells/uL   Absolute Monocytes 536 200 - 950 cells/uL     Eosinophils Absolute 130 15.0 - 500.0 cells/uL   Basophils Absolute 31 0.0 - 200.0 cells/uL   Neutrophils Relative % 56 %   Total Lymphocyte 30.6 %   Monocytes Relative 10.3 %   Eosinophils Relative 2.5 %   Basophils Relative 0.6 %  TSH  Result Value Ref Range   TSH 1.95 0.40 - 4.50 mIU/L  Lipid panel  Result Value Ref Range   Cholesterol 194 <200 mg/dL   HDL 46 > OR = 40 mg/dL   Triglycerides 127 (H) <150 mg/dL   LDL Cholesterol (Calc) 121 (H) mg/dL (calc)   Total CHOL/HDL Ratio 4.2 <5.0 (calc)   Non-HDL Cholesterol (Calc) 148 (H) <130 mg/dL (calc)   Last labs reviewed.    Assessment & Plan:      1. Attention deficit Has done very well with the addition of Strattera. Continue the current dose - 80 mg  Continue to monitor   2. Mixed hyperlipidemia Has changed his diet significantly to help with management, and also has helped him lose  weight as a result. Recheck the lipid panel today.  Do expect it to be improved.  3. Overweight Has had great success with weight loss, and congratulated on that.  Continuing with the dietary modifications and staying active with regular exercise as he does is important for weight management, and continue to monitor.  4. Lumbar disc disease hx 5. Encounter for monitoring chronic NSAID therapy On last labs, kidney function was good Continuing with regular  exercise also important to help with his back and he plans to continue with that  6. Dysthymia Symptoms remain much improved with no real concerns for depression or dysthymia presently  7.  Left Forearm strain-no concerns for a major tear noted today, and discussed slowly increasing activities in the gym again starting with very light weights at high reps, and slowly progressing to higher weights over weeks to help lessen any recurrence of injury.   Noted his blood pressure was borderline high today, and he noted it is often higher when checked in the doctor's office. He has not  used creatine supplement products in the last couple months (and encouraged not to use in the future as can increase blood pressures over time) Feel reasonable to continue to monitor at this point. Await lipid panel results Will schedule follow-up in a years time, to follow-up sooner as needed. I did notify him today the follow-up will be with a different provider as I will be leaving this practice prior to that plan to follow-up   Jamelle Haring, MD 05/17/20 8:31 AM

## 2020-05-17 ENCOUNTER — Ambulatory Visit: Payer: BC Managed Care – PPO | Admitting: Internal Medicine

## 2020-05-17 ENCOUNTER — Other Ambulatory Visit: Payer: Self-pay

## 2020-05-17 ENCOUNTER — Encounter: Payer: Self-pay | Admitting: Internal Medicine

## 2020-05-17 VITALS — BP 120/90 | HR 89 | Temp 98.1°F | Resp 16 | Ht 73.0 in | Wt 197.9 lb

## 2020-05-17 DIAGNOSIS — E782 Mixed hyperlipidemia: Secondary | ICD-10-CM

## 2020-05-17 DIAGNOSIS — S56912A Strain of unspecified muscles, fascia and tendons at forearm level, left arm, initial encounter: Secondary | ICD-10-CM

## 2020-05-17 DIAGNOSIS — E663 Overweight: Secondary | ICD-10-CM

## 2020-05-17 DIAGNOSIS — Z791 Long term (current) use of non-steroidal anti-inflammatories (NSAID): Secondary | ICD-10-CM

## 2020-05-17 DIAGNOSIS — Z5181 Encounter for therapeutic drug level monitoring: Secondary | ICD-10-CM

## 2020-05-17 DIAGNOSIS — F341 Dysthymic disorder: Secondary | ICD-10-CM

## 2020-05-17 DIAGNOSIS — R41840 Attention and concentration deficit: Secondary | ICD-10-CM | POA: Diagnosis not present

## 2020-05-17 DIAGNOSIS — M519 Unspecified thoracic, thoracolumbar and lumbosacral intervertebral disc disorder: Secondary | ICD-10-CM

## 2020-05-18 LAB — LIPID PANEL
Cholesterol: 198 mg/dL (ref <200)
HDL: 53 mg/dL (ref >=40)
LDL Cholesterol (Calc): 130 mg/dL (calc) — ABNORMAL HIGH
Non-HDL Cholesterol (Calc): 145 mg/dL (calc) — ABNORMAL HIGH (ref <130)
Total CHOL/HDL Ratio: 3.7 (calc) (ref <5.0)
Triglycerides: 61 mg/dL (ref <150)

## 2020-05-31 ENCOUNTER — Ambulatory Visit: Payer: BC Managed Care – PPO | Admitting: Internal Medicine

## 2020-08-04 ENCOUNTER — Ambulatory Visit: Payer: Self-pay | Admitting: *Deleted

## 2020-08-04 NOTE — Telephone Encounter (Signed)
Pt called to schedule an appt for possible side effects from covid/ Pt stated he was experiencing vertigo, Migraines and inability to concentrate since getting over covid/ please advise / nurse was not available in [facility]  Patient c/o persisting symptoms since having covid in Jan. 5, 2022. C/o vertigo at times, when standing or sitting up at gym. Head spinning and takes a few minutes to go away. Jan. 14 migraines noted at random times and pain noted behind left eye. Paitent has been awakened with migraine before. Denies vomiting but positive for nausea with migraines. Denies symptoms at this time. C/o inability to concentrate and is becoming more noticeable. 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 Denies chest pain, breathing difficulty, cough, fever, SOB. appt scheduled via My Chart virtual visit 08/16/20.gave patient [phone]  to post covid care center for assistance. Care advise given. Patient verbalized understanding of care advise and to call back or go to ED if symptoms worsen.    Reason for Disposition . Long COVID (e.g., post-COVID syndrome), questions about  Answer Assessment - Initial Assessment Questions 1. COVID-19 ONSET: "When did the symptoms of COVID-19 first start?"     After July 12, 2020 2. DIAGNOSIS CONFIRMATION: "How were you diagnosed?" (e.g., COVID-19 oral or nasal viral test; COVID-19 antibody test; doctor visit)     Na  3. MAIN SYMPTOM:  "What is your main concern or symptom right now?" (e.g., breathing difficulty, cough, fatigue. loss of smell)     Vertigo , migraines, inability to concentrate since getting over covid.  4. SYMPTOM ONSET: "When did the  Symptoms   start?"     Jan. 14 5. BETTER-SAME-WORSE: "Are you getting better, staying the same, or getting worse over the last 1 to 2 weeks?"     Staying the same  6. RECENT MEDICAL VISIT: "Have you been seen by a healthcare provider (doctor, NP, PA) for these persisting COVID-19 symptoms?" If  Yes, ask: "When were you seen?" (e.g., date)     No  7. COUGH: "Do you have a cough?" If Yes, ask: "How bad is the cough?"       na 8. FEVER: "Do you have a fever?" If Yes, ask: "What is your temperature, how was it measured, and when did it start?"     no 9. BREATHING DIFFICULTY: "Are you having any trouble breathing?" If Yes, ask: "How bad is your breathing?" (e.g., mild, moderate, severe)    - MILD: No SOB at rest, mild SOB with walking, speaks normally in sentences, can lie down, no retractions, pulse < 100.    - MODERATE: SOB at rest, SOB with minimal exertion and prefers to sit, cannot lie down flat, speaks in phrases, mild retractions, audible wheezing, pulse 100-120.    - SEVERE: Very SOB at rest, speaks in single words, struggling to breathe, sitting hunched forward, retractions, pulse > 120       no 10. HIGH RISK DISEASE: "Do you have any chronic medical problems?" (e.g., asthma, heart or lung disease, weak immune system, obesity, etc.)       no 11. VACCINE: "Have you gotten the COVID-19 vaccine?" If Yes ask: "Which one, how many shots, when did you get it?"       Yes Pfizer first 2 doses in June 2021. Has not gotten booster at this time due to getting covid in January 12. PREGNANCY: "Is there any chance you are pregnant?" "When was your last menstrual period?"  na 13. OTHER SYMPTOMS: "Do you have any other symptoms?"  (e.g., fatigue, headache, muscle pain, weakness)       Vertigo , migraines, inability to concentrate  Protocols used: CORONAVIRUS (COVID-19) PERSISTING SYMPTOMS FOLLOW-UP CALL-A-AH

## 2020-08-16 ENCOUNTER — Telehealth (INDEPENDENT_AMBULATORY_CARE_PROVIDER_SITE_OTHER): Payer: BC Managed Care – PPO | Admitting: Family Medicine

## 2020-08-16 ENCOUNTER — Encounter: Payer: Self-pay | Admitting: Family Medicine

## 2020-08-16 DIAGNOSIS — U099 Post covid-19 condition, unspecified: Secondary | ICD-10-CM | POA: Diagnosis not present

## 2020-08-16 NOTE — Assessment & Plan Note (Signed)
~  1.5 months of persistent symptoms (migraine, dizziness, concentration issues). Will refer to [facility] COVID Recovery clinic for long haulers. Can continue ibuprofen/tylenol for migraines, will alert [place] if worsens or symptoms change. Continue straterra.

## 2020-08-16 NOTE — Progress Notes (Signed)
Virtual Visit via Video Note  I connected with Ryan Roberts on 08/16/20 at  1:00 PM EST by a video enabled telemedicine application and verified that I am speaking with the correct person using two identifiers.  Location: Patient: home Provider: [facility]   I discussed the limitations of evaluation and management by telemedicine and the availability of in person appointments. The patient expressed understanding and agreed to proceed.  History of Present Illness:  COVID INFECTION - COVID+ 07/05/20 - symptom onset 07/07/20-07/10/20. Body aches, chills, sore throat. - has received 2 doses of Pfizer vaccination, last was end of June. - main sx subsided after a few days. - Persistent symptoms:  - migraines 2-3 per week, start near L eye with radiation to back of head. +nausea, photo/phonophobia. No vomiting. Taking tylenol prn, occasional relief. Occasionally will have to lay down.  - dizziness - mainly with positional changes but does have occasionally at rest. No vision changes, no troubles walking/talking, hearing loss. Everything spinning. Occasional tinnitus.  - concentration issues - "brain fog." Noticing mainly in early mornings as well as at night. Still taking strattera for ADHD with no recent changes.  - denies current fever, cough, SOB, chest pain.  Observations/Objective:  Well appearing, in NAD. Speaks in full sentences, no resp distress.   Assessment and Plan:  COVID-19 long hauler ~1.5 months of persistent symptoms (migraine, dizziness, concentration issues). Will refer to [facility] COVID Recovery clinic for long haulers. Can continue ibuprofen/tylenol for migraines, will alert [place] if worsens or symptoms change. Continue straterra.    I discussed the assessment and treatment plan with the patient. The patient was provided an opportunity to ask questions and all were answered. The patient agreed with the plan and demonstrated an understanding of the instructions.   The patient was  advised to call back or seek an in-person evaluation if the symptoms worsen or if the condition fails to improve as anticipated.  I provided 10 minutes of non-face-to-face time during this encounter.   Caro Laroche, DO

## 2020-08-16 NOTE — Patient Instructions (Signed)
It was great to see you!  Our plans for today:  - We are referring you to the COVID recovery clinic for long-haulers. If you do not hear about an appointment in the next few weeks, let us know.  - Continue to take ibuprofen/tylenol for your headaches, let us know if these change or worsen.  Take care and seek immediate care sooner if you develop any concerns.   Dr. Linwood Dibbles

## 2020-09-21 ENCOUNTER — Encounter: Payer: Self-pay | Admitting: Emergency Medicine

## 2020-09-21 ENCOUNTER — Ambulatory Visit: Payer: Self-pay | Admitting: *Deleted

## 2020-09-21 ENCOUNTER — Ambulatory Visit
Admission: EM | Admit: 2020-09-21 | Discharge: 2020-09-21 | Disposition: A | Discharge status: Home / Self Care | Payer: BC Managed Care – PPO | Attending: Sports Medicine | Admitting: Sports Medicine

## 2020-09-21 ENCOUNTER — Other Ambulatory Visit: Payer: Self-pay

## 2020-09-21 DIAGNOSIS — R1013 Epigastric pain: Secondary | ICD-10-CM | POA: Diagnosis not present

## 2020-09-21 DIAGNOSIS — I10 Essential (primary) hypertension: Secondary | ICD-10-CM | POA: Diagnosis not present

## 2020-09-21 DIAGNOSIS — R110 Nausea: Secondary | ICD-10-CM | POA: Diagnosis not present

## 2020-09-21 DIAGNOSIS — K2900 Acute gastritis without bleeding: Secondary | ICD-10-CM | POA: Insufficient documentation

## 2020-09-21 DIAGNOSIS — R109 Unspecified abdominal pain: Secondary | ICD-10-CM

## 2020-09-21 DIAGNOSIS — R0602 Shortness of breath: Secondary | ICD-10-CM | POA: Insufficient documentation

## 2020-09-21 LAB — CBC WITH DIFFERENTIAL/PLATELET
Abs Immature Granulocytes: 0.01 10*3/uL (ref 0.00–0.07)
Basophils Absolute: 0 10*3/uL (ref 0.0–0.1)
Basophils Relative: 0 %
Eosinophils Absolute: 0 10*3/uL (ref 0.0–0.5)
Eosinophils Relative: 1 %
HCT: 49.4 % (ref 39.0–52.0)
Hemoglobin: 17.3 g/dL — ABNORMAL HIGH (ref 13.0–17.0)
Immature Granulocytes: 0 %
Lymphocytes Relative: 17 %
Lymphs Abs: 1 10*3/uL (ref 0.7–4.0)
MCH: 30.2 pg (ref 26.0–34.0)
MCHC: 35 g/dL (ref 30.0–36.0)
MCV: 86.4 fL (ref 80.0–100.0)
Monocytes Absolute: 0.8 10*3/uL (ref 0.1–1.0)
Monocytes Relative: 13 %
Neutro Abs: 4 10*3/uL (ref 1.7–7.7)
Neutrophils Relative %: 69 %
Platelets: 224 10*3/uL (ref 150–400)
RBC: 5.72 MIL/uL (ref 4.22–5.81)
RDW: 11.5 % (ref 11.5–15.5)
WBC: 5.9 10*3/uL (ref 4.0–10.5)
nRBC: 0 % (ref 0.0–0.2)

## 2020-09-21 LAB — COMPREHENSIVE METABOLIC PANEL
ALT: 26 U/L (ref 0–44)
AST: 23 U/L (ref 15–41)
Albumin: 4.8 g/dL (ref 3.5–5.0)
Alkaline Phosphatase: 57 U/L (ref 38–126)
Anion gap: 8 (ref 5–15)
BUN: 16 mg/dL (ref 6–20)
CO2: 26 mmol/L (ref 22–32)
Calcium: 8.9 mg/dL (ref 8.9–10.3)
Chloride: 103 mmol/L (ref 98–111)
Creatinine, Ser: 0.95 mg/dL (ref 0.61–1.24)
GFR, Estimated: >60 mL/min (ref >60)
Glucose, Bld: 104 mg/dL — ABNORMAL HIGH (ref 70–99)
Potassium: 4.3 mmol/L (ref 3.5–5.1)
Sodium: 137 mmol/L (ref 135–145)
Total Bilirubin: 0.8 mg/dL (ref 0.3–1.2)
Total Protein: 8 g/dL (ref 6.5–8.1)

## 2020-09-21 LAB — URINALYSIS, COMPLETE (UACMP) WITH MICROSCOPIC
Bacteria, UA: NONE SEEN
Glucose, UA: NEGATIVE mg/dL
Hgb urine dipstick: NEGATIVE
Ketones, ur: NEGATIVE mg/dL
Leukocytes,Ua: NEGATIVE
Nitrite: NEGATIVE
RBC / HPF: NONE SEEN RBC/hpf (ref 0–5)
Specific Gravity, Urine: >1.03 — ABNORMAL HIGH (ref 1.005–1.030)
Squamous Epithelial / LPF: NONE SEEN (ref 0–5)
pH: 5.5 (ref 5.0–8.0)

## 2020-09-21 LAB — AMYLASE: Amylase: 57 U/L (ref 28–100)

## 2020-09-21 LAB — LIPASE, BLOOD: Lipase: 35 U/L (ref 11–51)

## 2020-09-21 MED ORDER — ONDANSETRON 4 MG PO TBDP: 4.0000 mg | ORAL_TABLET | Freq: Three times a day (TID) | ORAL | 0 refills | Status: AC | PRN

## 2020-09-21 MED ORDER — ALUM & MAG HYDROXIDE-SIMETH 200-200-20 MG/5ML PO SUSP
30.0000 mL | Freq: Once | ORAL | Status: AC
  Administered 2020-09-21: 30 mL via ORAL

## 2020-09-21 MED ORDER — LIDOCAINE VISCOUS HCL 2 % MT SOLN
15.0000 mL | Freq: Once | OROMUCOSAL | Status: AC
  Administered 2020-09-21: 15 mL via ORAL

## 2020-09-21 MED ORDER — PANTOPRAZOLE SODIUM 40 MG PO TBEC: 40.0000 mg | DELAYED_RELEASE_TABLET | Freq: Every day | ORAL | 1 refills | Status: DC

## 2020-09-21 MED ORDER — SUCRALFATE 1 G PO TABS: 1.0000 g | ORAL_TABLET | Freq: Three times a day (TID) | ORAL | 0 refills | Status: DC

## 2020-09-21 NOTE — Telephone Encounter (Signed)
Patient is calling to report he has had abdominal pain since yesterday with changes in bowels- diarrhea and nausea. Patient thinks he may have ulcer. Advised UC per disposition no available appointment within protocol.  Reason for Disposition  [1] MILD-MODERATE pain AND [2] constant AND [3] present > 2 hours  Answer Assessment - Initial Assessment Questions 1. LOCATION: "Where does it hurt?"      Upper/lower abdomen- center to left 2. RADIATION: "Does the pain shoot anywhere else?" (e.g., chest, back)     Can go upward 3. ONSET: "When did the pain begin?" (Minutes, hours or days ago)      yesterday 4. SUDDEN: "Gradual or sudden onset?"     gradual 5. PATTERN "Does the pain come and go, or is it constant?"    - If constant: "Is it getting better, staying the same, or worsening?"      (Note: Constant means the pain never goes away completely; most serious pain is constant and it progresses)     - If intermittent: "How long does it last?" "Do you have pain now?"     (Note: Intermittent means the pain goes away completely between bouts)     Constant- can be intense to dull 6. SEVERITY: "How bad is the pain?"  (e.g., Scale 1-10; mild, moderate, or severe)    - MILD (1-3): doesn't interfere with normal activities, abdomen soft and not tender to touch     - MODERATE (4-7): interferes with normal activities or awakens from sleep, tender to touch     - SEVERE (8-10): excruciating pain, doubled over, unable to do any normal activities       moderate 7. RECURRENT SYMPTOM: "Have you ever had this type of stomach pain before?" If Yes, ask: "When was the last time?" and "What happened that time?"      no 8. CAUSE: "What do you think is causing the stomach pain?"     Possible ulcer/GERD 9. RELIEVING/AGGRAVATING FACTORS: "What makes it better or worse?" (e.g., movement, antacids, bowel movement)     Eating makes pain worse 10. OTHER SYMPTOMS: "Has there been any vomiting, diarrhea, constipation, or  urine problems?"       Diarrhea, nausea,sweating  Protocols used: ABDOMINAL PAIN - MALE-A-AH

## 2020-09-21 NOTE — Discharge Instructions (Addendum)
Your lab work is all very reassuring.  Your clinical presentation is consistent with gastritis and acid reflux.  I am unsure if you have a stomach ulcer or not so I will place a referral to GI but the treatment is the same at this time.  I sent a prescription for pantoprazole which is an antacid.  Take that daily for reflux.  I also sent Carafate which will help with reflux as well.  Avoid any trigger foods such as fatty greasy or spicy foods.  Zofran has been sent for nausea and vomiting.  Make sure you are increasing rest and fluids.  He can take Tylenol for discomfort but avoid any anti-inflammatory medications.  Avoid caffeine and alcohol.  See ED red flag signs and symptoms for abdominal pain below.  ABDOMINAL PAIN: You may take Tylenol for pain relief. Use medications as directed including antiemetics and antidiarrheal medications if suggested or prescribed. You should increase fluids and electrolytes as well as rest over these next several days. If you have any questions or concerns, or if your symptoms are not improving or if especially if they acutely worsen, please call or stop back to the clinic immediately and we will be happy to help you or go to the ER   ABDOMINAL PAIN RED FLAGS: Seek immediate further care if: symptoms remain the same or worsen over the next 3-7 days, you are unable to keep fluids down, you see blood or mucus in your stool, you vomit black or dark red material, you have a fever of 101.F or higher, you have localized and/or persistent abdominal pain

## 2020-09-21 NOTE — ED Triage Notes (Signed)
Patient c/o abdominal pain, nausea, diarrhea, sweats that started yesterday. He states the pain increases after he eats. He also reports confusion and shortness of breath.

## 2020-09-21 NOTE — ED Provider Notes (Signed)
[facility]    CSN: [id number] Arrival date & time: 09/21/20  1111      History   Chief Complaint Chief Complaint  Patient presents with  . Abdominal Pain  . Nausea  . Diarrhea  . Shortness of Breath    HPI Ryan Roberts is a 30 y.o. male presenting for upper abdominal "burning pain" that has been constant since yesterday.  Pain is made worse by eating anything, especially greasy and spicy foods which she says he had yesterday.  Pain is not as bad today as it was yesterday but he still feels very unwell.  He also reports fatigue and associated nausea and diarrhea.  Patient says that he woke up last night and was very sweaty and felt short of breath.  He also says that he felt a little "disoriented."  He is not feeling that way currently.  Denies any blood in the stools but says they have been darker but not black.  Has not had any fevers.  Abdominal pain does not radiate to his back.  He has not had any urinary symptoms.  Patient says he is under a lot of stress.  He says that he used to take NSAIDs chronically for herniated disks in his back but over the past 6 months he is only been taking NSAIDs 1-2 times per week.  He does admit to history of acid reflux but says he does not take anything for it.  He has not taken any medication for symptoms today or yesterday.  He does not smoke or drink alcohol.  He also does not report much caffeine use.  He has no other complaints or concerns.  HPI  Past Medical History:  Diagnosis Date  . Lumbar herniated disc     Patient Active Problem List   Diagnosis Date Noted  . 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 Overweight 11/19/2019  . Mixed hyperlipidemia 11/19/2019  . Attention deficit 09/24/2019  . Lumbar disc disease 09/24/2019  . Encounter for monitoring chronic NSAID therapy 09/24/2019  . Dysthymia 09/24/2019    Past Surgical History:  Procedure Laterality Date  . TONSILLECTOMY         Home Medications    Prior to  Admission medications   Medication Sig Start Date End Date Taking? Authorizing Provider  atomoxetine (STRATTERA) 80 MG capsule TAKE 1 CAPSULE(80 MG) BY MOUTH DAILY 05/16/20  Yes Jamelle Haring, MD  ondansetron (ZOFRAN ODT) 4 MG disintegrating tablet Take 1 tablet (4 mg total) by mouth every 8 (eight) hours as needed for up to 5 days for nausea or vomiting. 09/21/20 09/26/20 Yes Shirlee Latch, PA-C  pantoprazole (PROTONIX) 40 MG tablet Take 1 tablet (40 mg total) by mouth daily for 14 days. 09/21/20 10/05/20 Yes Eusebio Friendly B, PA-C  sucralfate (CARAFATE) 1 g tablet Take 1 tablet (1 g total) by mouth 4 (four) times daily -  with meals and at bedtime for 7 days. 09/21/20 09/28/20 Yes Shirlee Latch, PA-C    Family History Family History  Problem Relation Age of Onset  . Diabetes Mother   . Cancer Father   . ADD / ADHD Father   . Colon cancer Maternal Grandmother   . Heart attack Maternal Grandfather   . Diabetes Maternal Grandfather   . Cancer Paternal Grandmother   . Lung cancer Paternal Grandfather     Social History Social History   Tobacco Use  . Smoking status: Never Smoker  . Smokeless tobacco: Never Used  Vaping Use  . Vaping Use: Never used  Substance Use Topics  . Alcohol use: No  . Drug use: No     Allergies   No known allergies   Review of Systems Review of Systems  Constitutional: Positive for diaphoresis and fatigue. Negative for fever.  HENT: Negative for congestion, rhinorrhea, sinus pressure, sinus pain and sore throat.   Respiratory: Positive for shortness of breath. Negative for cough.   Cardiovascular: Negative for chest pain.  Gastrointestinal: Positive for abdominal pain and nausea. Negative for diarrhea and vomiting.  Genitourinary: Negative for dysuria and frequency.  Musculoskeletal: Negative for back pain and myalgias.  Neurological: Negative for weakness, light-headedness and headaches.  Hematological: Negative for adenopathy.      Physical Exam Triage Vital Signs ED Triage Vitals  Enc Vitals Group     BP 09/21/20 1159 (!) 135/100     Pulse Rate 09/21/20 1159 92     Resp 09/21/20 1159 18     Temp 09/21/20 1159 98.2 F (36.8 C)     Temp Source 09/21/20 1159 Oral     SpO2 09/21/20 1159 100 %     Weight 09/21/20 1157 210 lb (95.3 kg)     Height 09/21/20 1157 6\' 1"  (1.854 m)     Head Circumference --      Peak Flow --      Pain Score 09/21/20 1157 6     Pain Loc --      Pain Edu? --      Excl. in GC? --    No data found.  Updated Vital Signs BP (!) 140/94 (BP Location: Right Arm)   Pulse 92   Temp 98.2 F (36.8 C) (Oral)   Resp 18   Ht 6\' 1"  (1.854 m)   Wt 210 lb (95.3 kg)   SpO2 100%   BMI 27.71 kg/m    Physical Exam Vitals and nursing note reviewed.  Constitutional:      General: He is not in acute distress.    Appearance: He is well-developed and normal weight. He is ill-appearing. He is not toxic-appearing or diaphoretic.  HENT:     Head: Normocephalic and atraumatic.     Nose: Nose normal.     Mouth/Throat:     Mouth: Mucous membranes are moist.     Pharynx: Oropharynx is clear.  Eyes:     General: No scleral icterus.    Conjunctiva/sclera: Conjunctivae normal.  Cardiovascular:     Rate and Rhythm: Normal rate and regular rhythm.     Heart sounds: Normal heart sounds.  Pulmonary:     Effort: Pulmonary effort is normal. No respiratory distress.     Breath sounds: Normal breath sounds.  Abdominal:     Palpations: Abdomen is soft.     Tenderness: There is abdominal tenderness in the right upper quadrant, epigastric area and left upper quadrant. There is no right CVA tenderness, left CVA tenderness, guarding or rebound.  Musculoskeletal:     Cervical back: Neck supple.  Skin:    General: Skin is warm and dry.  Neurological:     General: No focal deficit present.     Mental Status: He is alert. Mental status is at baseline.     Motor: No weakness.     Gait: Gait normal.   Psychiatric:        Mood and Affect: Mood normal.        Behavior: Behavior normal.        Thought  Content: Thought content normal.      UC Treatments / Results  Labs (all labs ordered are listed, but only abnormal results are displayed) Labs Reviewed  URINALYSIS, COMPLETE (UACMP) WITH MICROSCOPIC - Abnormal; Notable for the following components:      Result Value   Specific Gravity, Urine >1.030 (*)    Bilirubin Urine SMALL (*)    Protein, ur TRACE (*)    All other components within normal limits  COMPREHENSIVE METABOLIC PANEL - Abnormal; Notable for the following components:   Glucose, Bld 104 (*)    All other components within normal limits  CBC WITH DIFFERENTIAL/PLATELET - Abnormal; Notable for the following components:   Hemoglobin 17.3 (*)    All other components within normal limits  LIPASE, BLOOD  AMYLASE    EKG   Radiology No results found.  Procedures ED EKG  Date/Time: 09/21/2020 2:48 PM Performed by: Shirlee Latch, PA-C Authorized by: Delton See, MD   ECG reviewed by ED Physician in the absence of a cardiologist: yes   Previous ECG:    Previous ECG:  Unavailable Interpretation:    Interpretation: normal   Rate:    ECG rate:  97   ECG rate assessment: normal   Rhythm:    Rhythm: sinus rhythm   Ectopy:    Ectopy: none   QRS:    QRS axis:  Normal ST segments:    ST segments:  Normal T waves:    T waves: normal   Comments:     Normal sinus rhythm and regular rate   (including critical care time)  Medications Ordered in UC Medications  alum & mag hydroxide-simeth (MAALOX/MYLANTA) 200-200-20 MG/5ML suspension 30 mL (30 mLs Oral Given 09/21/20 1228)    And  lidocaine (XYLOCAINE) 2 % viscous mouth solution 15 mL (15 mLs Oral Given 09/21/20 1228)    Initial Impression / Assessment and Plan / UC Course  I have reviewed the triage vital signs and the nursing notes.  Pertinent labs & imaging results that were available during my care of  the patient were reviewed by me and considered in my medical decision making (see chart for details).   30 year old male presenting for upper abdominal pain with associated nausea, sweats, and diarrhea.  He describes the pain as "burning and constant and worse with food."  All vital signs are stable clinic.  He is afebrile.  He is ill appearing but not toxic.  Exam significant for tenderness of the epigastric, right upper quadrant and left upper quadrant regions.  No guarding or rebound.  His chest is clear to auscultation and heart regular rate and rhythm.  Patient given GI cocktail and he did state that it improved his symptoms somewhat.  UA performed today shows >1.030 specific gravity, small bili and trace protein.  CBC with normal finding CMP with glucose 104, otherwise normal Amylase and lipase are normal  He declined CXR  Reviewed results with patient.  Symptoms are consistent with GERD and gastritis.  Advised him of the possibility of PUD and I did place a GI referral for this in case his symptoms continue but treating with pantoprazole 40 mg daily, Carafate, and Zofran as needed for nausea.  Also advised him to modify his diet to avoid any trigger foods.  I did review ED precautions with patient.  He knows to go to the ED if he develops any worsening abdominal pain, has blood in the stool, increased fatigue or weakness or runs a fever.  Final Clinical Impressions(s) / UC Diagnoses   Final diagnoses:  Abdominal pain, epigastric  Acute gastritis without hemorrhage, unspecified gastritis type  Nausea  Shortness of breath     Discharge Instructions     Your lab work is all very reassuring.  Your clinical presentation is consistent with gastritis and acid reflux.  I am unsure if you have a stomach ulcer or not so I will place a referral to GI but the treatment is the same at this time.  I sent a prescription for pantoprazole which is an antacid.  Take that daily for reflux.  I also  sent Carafate which will help with reflux as well.  Avoid any trigger foods such as fatty greasy or spicy foods.  Zofran has been sent for nausea and vomiting.  Make sure you are increasing rest and fluids.  He can take Tylenol for discomfort but avoid any anti-inflammatory medications.  Avoid caffeine and alcohol.  See ED red flag signs and symptoms for abdominal pain below.  ABDOMINAL PAIN: You may take Tylenol for pain relief. Use medications as directed including antiemetics and antidiarrheal medications if suggested or prescribed. You should increase fluids and electrolytes as well as rest over these next several days. If you have any questions or concerns, or if your symptoms are not improving or if especially if they acutely worsen, please call or stop back to the clinic immediately and we will be happy to help you or go to the ER   ABDOMINAL PAIN RED FLAGS: Seek immediate further care if: symptoms remain the same or worsen over the next 3-7 days, you are unable to keep fluids down, you see blood or mucus in your stool, you vomit black or dark red material, you have a fever of 101.F or higher, you have localized and/or persistent abdominal pain      ED Prescriptions    Medication Sig Dispense Auth. Provider   pantoprazole (PROTONIX) 40 MG tablet Take 1 tablet (40 mg total) by mouth daily for 14 days. 14 tablet Eusebio FriendlyEaves, Blayze Haen B, PA-C   sucralfate (CARAFATE) 1 g tablet Take 1 tablet (1 g total) by mouth 4 (four) times daily -  with meals and at bedtime for 7 days. 28 tablet Eusebio FriendlyEaves, Kameran Mcneese B, PA-C   ondansetron (ZOFRAN ODT) 4 MG disintegrating tablet Take 1 tablet (4 mg total) by mouth every 8 (eight) hours as needed for up to 5 days for nausea or vomiting. 15 tablet Gareth MorganEaves, Rebeka Kimble B, PA-C     PDMP not reviewed this encounter.   Shirlee Latchaves, Jerimyah Vandunk B, PA-C 09/21/20 1454

## 2020-09-25 ENCOUNTER — Encounter: Payer: Self-pay | Admitting: *Deleted

## 2020-11-15 ENCOUNTER — Other Ambulatory Visit: Payer: Self-pay

## 2020-11-15 DIAGNOSIS — R41840 Attention and concentration deficit: Secondary | ICD-10-CM

## 2020-11-15 NOTE — Telephone Encounter (Signed)
Requested medication (s) are due for refill today: yes  Requested medication (s) are on the active medication list: yes  Last refill:  05/16/20 #90 1 refill  Future visit scheduled: no  Notes to clinic:  not delegated per protocol, last seen by Dorris Fetch, MD     Requested Prescriptions  Pending Prescriptions Disp Refills   atomoxetine (STRATTERA) 80 MG capsule 90 capsule 1      Not Delegated - Psychiatry: Norepinephrine Reuptake Inhibitor Failed - 11/15/2020  5:13 PM      Failed - This refill cannot be delegated      Passed - Valid encounter within last 6 months    Recent Outpatient Visits           3 months ago COVID-19 long hauler   [facility] Caro Laroche, DO   6 months ago Attention deficit   [facility] Jamelle Haring, MD   12 months ago Attention deficit   [facility] Jamelle Haring, MD   1 year ago Attention deficit   [facility] Jamelle Haring, MD

## 2020-11-15 NOTE — Telephone Encounter (Unsigned)
Copied from CRM [id number]. Topic: Quick Communication - Rx Refill/Question >> Nov 15, 2020  4:46 PM Gaetana Michaelis A wrote: Medication: atomoxetine (STRATTERA) 80 MG capsule   Has the patient contacted their pharmacy? Yes. Patient contacted the pharmacy and was directed to contact their PCP  Preferred Pharmacy (with phone number or street name): [id number] [place], [place] - [address] AT [facility] [address]  Phone:  [phone] Fax:  [phone]  Agent: Please be advised that RX refills may take up to 3 business days. We ask that you follow-up with your pharmacy.

## 2020-11-16 NOTE — Telephone Encounter (Signed)
Pt has an appt on 11/21/20 with Gabriel Cirri and he is ok on his meds until his appt

## 2020-11-21 ENCOUNTER — Other Ambulatory Visit: Payer: Self-pay

## 2020-11-21 ENCOUNTER — Ambulatory Visit: Payer: BC Managed Care – PPO | Admitting: Unknown Physician Specialty

## 2020-11-21 ENCOUNTER — Encounter: Payer: Self-pay | Admitting: Unknown Physician Specialty

## 2020-11-21 DIAGNOSIS — R41840 Attention and concentration deficit: Secondary | ICD-10-CM

## 2020-11-21 MED ORDER — ATOMOXETINE HCL 80 MG PO CAPS
80.0000 mg | ORAL_CAPSULE | Freq: Every day | ORAL | 1 refills | Status: DC
Start: 2020-11-21 — End: 2021-05-23

## 2020-11-21 NOTE — Progress Notes (Signed)
BP 110/72   Pulse 98   Temp 98.8 F (37.1 C) (Oral)   Resp 18   Ht 6\' 1"  (1.854 m)   Wt 211 lb 14.4 oz (96.1 kg)   SpO2 99%   BMI 27.96 kg/m    Subjective:    Patient ID: Ryan Roberts, male    DOB: 11/03/1990, 30 y.o.   MRN: [id number]  HPI: Ryan Roberts is a 30 y.o. male  Chief Complaint  Patient presents with  . Follow-up    Refills on Stratera   Pt is here for refills of Statera.  He says it works well for him.  He is a 37.  States he is not losing things and able to stay focused on his studies.  He would like to continue.  No adverse side-effects.    Depression screen [facility] 2/9 11/21/2020 08/16/2020 05/17/2020 11/19/2019 09/24/2019  Decreased Interest 0 0 0 0 0  Down, Depressed, Hopeless 0 0 0 0 1  PHQ - 2 Score 0 0 0 0 1  Altered sleeping - - - 0 1  Tired, decreased energy - - - 0 0  Change in appetite - - - 0 0  Feeling bad or failure about yourself  - - - 0 0  Trouble concentrating - - - 1 3  Moving slowly or fidgety/restless - - - 0 1  Suicidal thoughts - - - 0 -  PHQ-9 Score - - - 1 6  Difficult doing work/chores - - - Not difficult at all Very difficult     Relevant past medical, surgical, family and social history reviewed and updated as indicated. Interim medical history since our last visit reviewed. Allergies and medications reviewed and updated.  Review of Systems  Per HPI unless specifically indicated above     Objective:    BP 110/72   Pulse 98   Temp 98.8 F (37.1 C) (Oral)   Resp 18   Ht 6\' 1"  (1.854 m)   Wt 211 lb 14.4 oz (96.1 kg)   SpO2 99%   BMI 27.96 kg/m   Wt Readings from Last 3 Encounters:  11/21/20 211 lb 14.4 oz (96.1 kg)  09/21/20 210 lb (95.3 kg)  05/17/20 197 lb 14.4 oz (89.8 kg)    Physical Exam Constitutional:      General: He is not in acute distress.    Appearance: Normal appearance. He is well-developed.  HENT:     Head: Normocephalic and atraumatic.  Eyes:     General: Lids are normal. No scleral  icterus.       Right eye: No discharge.        Left eye: No discharge.     Conjunctiva/sclera: Conjunctivae normal.  Neck:     Vascular: No carotid bruit or JVD.  Cardiovascular:     Rate and Rhythm: Normal rate and regular rhythm.     Heart sounds: Normal heart sounds.  Pulmonary:     Effort: Pulmonary effort is normal. No respiratory distress.     Breath sounds: Normal breath sounds.  Abdominal:     Palpations: There is no hepatomegaly or splenomegaly.  Musculoskeletal:        General: Normal range of motion.     Cervical back: Normal range of motion and neck supple.  Skin:    General: Skin is warm and dry.     Coloration: Skin is not pale.     Findings: No rash.  Neurological:     Mental Status:  He is alert and oriented to person, place, and time.  Psychiatric:        Behavior: Behavior normal.        Thought Content: Thought content normal.        Judgment: Judgment normal.     Results for orders placed or performed during the hospital encounter of 09/21/20  Urinalysis, Complete w Microscopic Urine, Clean Catch  Result Value Ref Range   Color, Urine YELLOW YELLOW   APPearance CLEAR CLEAR   Specific Gravity, Urine >1.030 (H) 1.005 - 1.030   pH 5.5 5.0 - 8.0   Glucose, UA NEGATIVE NEGATIVE mg/dL   Hgb urine dipstick NEGATIVE NEGATIVE   Bilirubin Urine SMALL (A) NEGATIVE   Ketones, ur NEGATIVE NEGATIVE mg/dL   Protein, ur TRACE (A) NEGATIVE mg/dL   Nitrite NEGATIVE NEGATIVE   Leukocytes,Ua NEGATIVE NEGATIVE   Squamous Epithelial / LPF NONE SEEN 0 - 5   WBC, UA 0-5 0 - 5 WBC/hpf   RBC / HPF NONE SEEN 0 - 5 RBC/hpf   Bacteria, UA NONE SEEN NONE SEEN   Mucus PRESENT   Comprehensive metabolic panel  Result Value Ref Range   Sodium 137 135 - 145 mmol/L   Potassium 4.3 3.5 - 5.1 mmol/L   Chloride 103 98 - 111 mmol/L   CO2 26 22 - 32 mmol/L   Glucose, Bld 104 (H) 70 - 99 mg/dL   BUN 16 6 - 20 mg/dL   Creatinine, Ser 9.73 0.61 - 1.24 mg/dL   Calcium 8.9 8.9 - 53.2  mg/dL   Total Protein 8.0 6.5 - 8.1 g/dL   Albumin 4.8 3.5 - 5.0 g/dL   AST 23 15 - 41 U/L   ALT 26 0 - 44 U/L   Alkaline Phosphatase 57 38 - 126 U/L   Total Bilirubin 0.8 0.3 - 1.2 mg/dL   GFR, Estimated >99 >24 mL/min   Anion gap 8 5 - 15  Lipase, blood  Result Value Ref Range   Lipase 35 11 - 51 U/L  CBC with Differential  Result Value Ref Range   WBC 5.9 4.0 - 10.5 K/uL   RBC 5.72 4.22 - 5.81 MIL/uL   Hemoglobin 17.3 (H) 13.0 - 17.0 g/dL   HCT 26.8 34.1 - 96.2 %   MCV 86.4 80.0 - 100.0 fL   MCH 30.2 26.0 - 34.0 pg   MCHC 35.0 30.0 - 36.0 g/dL   RDW 22.9 79.8 - 92.1 %   Platelets 224 150 - 400 K/uL   nRBC 0.0 0.0 - 0.2 %   Neutrophils Relative % 69 %   Neutro Abs 4.0 1.7 - 7.7 K/uL   Lymphocytes Relative 17 %   Lymphs Abs 1.0 0.7 - 4.0 K/uL   Monocytes Relative 13 %   Monocytes Absolute 0.8 0.1 - 1.0 K/uL   Eosinophils Relative 1 %   Eosinophils Absolute 0.0 0.0 - 0.5 K/uL   Basophils Relative 0 %   Basophils Absolute 0.0 0.0 - 0.1 K/uL   Immature Granulocytes 0 %   Abs Immature Granulocytes 0.01 0.00 - 0.07 K/uL  Amylase  Result Value Ref Range   Amylase 57 28 - 100 U/L      Assessment & Plan:   Problem List Items Addressed This Visit      Unprioritized   Attention deficit    Continue Strattera at the current dose.  F/u 6 months      Relevant Medications   atomoxetine (STRATTERA) 80 MG capsule  Follow up plan: Return in about 6 months (around 05/24/2021).

## 2020-11-21 NOTE — Assessment & Plan Note (Signed)
Continue Strattera at the current dose.  F/u 6 months

## 2021-04-13 DIAGNOSIS — M25552 Pain in left hip: Secondary | ICD-10-CM | POA: Diagnosis not present

## 2021-04-13 DIAGNOSIS — M5416 Radiculopathy, lumbar region: Secondary | ICD-10-CM | POA: Diagnosis not present

## 2021-05-16 DIAGNOSIS — M5416 Radiculopathy, lumbar region: Secondary | ICD-10-CM | POA: Diagnosis not present

## 2021-05-20 ENCOUNTER — Other Ambulatory Visit: Payer: Self-pay | Admitting: Unknown Physician Specialty

## 2021-05-20 DIAGNOSIS — R41840 Attention and concentration deficit: Secondary | ICD-10-CM

## 2021-05-20 NOTE — Telephone Encounter (Signed)
Last refill: 11/21/20 #90 1 RF- no future appt.  MyChart message sent to pt to make appt for further refills.  Requested Prescriptions  Pending Prescriptions Disp Refills   atomoxetine (STRATTERA) 80 MG capsule [Pharmacy Med Name: ATOMOXETINE 80MG  CAPSULES] 90 capsule 1    Sig: TAKE 1 CAPSULE(80 MG) BY MOUTH DAILY     Not Delegated - Psychiatry: Norepinephrine Reuptake Inhibitor Failed - 05/20/2021  3:39 AM      Failed - This refill cannot be delegated      Passed - Valid encounter within last 6 months    Recent Outpatient Visits           6 months ago Attention deficit   [facility], NP   9 months ago COVID-19 long hauler   [facility] [place] [place], DO   1 year ago Attention deficit   [facility], MD   1 year ago Attention deficit   [facility], MD   1 year ago Attention deficit   [facility], MD

## 2021-05-21 NOTE — Telephone Encounter (Signed)
Appt made for refills 

## 2021-05-23 ENCOUNTER — Encounter: Payer: Self-pay | Admitting: Nurse Practitioner

## 2021-05-23 ENCOUNTER — Other Ambulatory Visit: Payer: Self-pay

## 2021-05-23 ENCOUNTER — Ambulatory Visit: Payer: BC Managed Care – PPO | Admitting: Nurse Practitioner

## 2021-05-23 DIAGNOSIS — R41840 Attention and concentration deficit: Secondary | ICD-10-CM | POA: Diagnosis not present

## 2021-05-23 DIAGNOSIS — M5416 Radiculopathy, lumbar region: Secondary | ICD-10-CM | POA: Diagnosis not present

## 2021-05-23 MED ORDER — ATOMOXETINE HCL 80 MG PO CAPS
80.0000 mg | ORAL_CAPSULE | Freq: Every day | ORAL | 1 refills | Status: DC
Start: 2021-05-23 — End: 2021-10-18

## 2021-05-23 NOTE — Progress Notes (Signed)
BP 120/80   Pulse 89   Temp (!) 97.5 F (36.4 C) (Oral)   Resp 18   Ht 6\' 1"  (1.854 m)   Wt 224 lb 1.6 oz (101.7 kg)   SpO2 98%   BMI 29.57 kg/m    Subjective:    Patient ID: Ryan Roberts, male    DOB: 07/14/1990, 30 y.o.   MRN: [id number]  HPI: Ryan Roberts is a 30 y.o. male, here alone  Chief Complaint  Patient presents with   Follow-up    Medication refill ADD   ADD:  He says that the medication has made a world of difference.  He is able to focus.  He denies any side effects.  He is getting excited about having a son in a few weeks.  He is a 37 and is able to focus on his studies and would like to continue current plan of care.   Depression screen [facility] 2/9 05/23/2021 11/21/2020 08/16/2020 05/17/2020 11/19/2019  Decreased Interest 0 0 0 0 0  Down, Depressed, Hopeless 0 0 0 0 0  PHQ - 2 Score 0 0 0 0 0  Altered sleeping - - - - 0  Tired, decreased energy - - - - 0  Change in appetite - - - - 0  Feeling bad or failure about yourself  - - - - 0  Trouble concentrating - - - - 1  Moving slowly or fidgety/restless - - - - 0  Suicidal thoughts - - - - 0  PHQ-9 Score - - - - 1  Difficult doing work/chores - - - - Not difficult at all    Relevant past medical, surgical, family and social history reviewed and updated as indicated. Interim medical history since our last visit reviewed. Allergies and medications reviewed and updated.  Review of Systems  Constitutional: Negative for fever or weight change.  Respiratory: Negative for cough and shortness of breath.   Cardiovascular: Negative for chest pain or palpitations.  Gastrointestinal: Negative for abdominal pain, no bowel changes.  Musculoskeletal: Negative for gait problem or joint swelling.  Skin: Negative for rash.  Neurological: Negative for dizziness or headache.  No other specific complaints in a complete review of systems (except as listed in HPI above).      Objective:    BP 120/80   Pulse 89    Temp (!) 97.5 F (36.4 C) (Oral)   Resp 18   Ht 6\' 1"  (1.854 m)   Wt 224 lb 1.6 oz (101.7 kg)   SpO2 98%   BMI 29.57 kg/m   Wt Readings from Last 3 Encounters:  05/23/21 224 lb 1.6 oz (101.7 kg)  11/21/20 211 lb 14.4 oz (96.1 kg)  09/21/20 210 lb (95.3 kg)    Physical Exam  Constitutional: Patient appears well-developed and well-nourished. No distress.  HEENT: head atraumatic, normocephalic, pupils equal and reactive to light, neck supple Cardiovascular: Normal rate, regular rhythm and normal heart sounds.  No murmur heard. No BLE edema. Pulmonary/Chest: Effort normal and breath sounds normal. No respiratory distress. Abdominal: Soft.  There is no tenderness. Psychiatric: Patient has a normal mood and affect. behavior is normal. Judgment and thought content normal.   Results for orders placed or performed during the hospital encounter of 09/21/20  Urinalysis, Complete w Microscopic Urine, Clean Catch  Result Value Ref Range   Color, Urine YELLOW YELLOW   APPearance CLEAR CLEAR   Specific Gravity, Urine >1.030 (H) 1.005 - 1.030   pH 5.5  5.0 - 8.0   Glucose, UA NEGATIVE NEGATIVE mg/dL   Hgb urine dipstick NEGATIVE NEGATIVE   Bilirubin Urine SMALL (A) NEGATIVE   Ketones, ur NEGATIVE NEGATIVE mg/dL   Protein, ur TRACE (A) NEGATIVE mg/dL   Nitrite NEGATIVE NEGATIVE   Leukocytes,Ua NEGATIVE NEGATIVE   Squamous Epithelial / LPF NONE SEEN 0 - 5   WBC, UA 0-5 0 - 5 WBC/hpf   RBC / HPF NONE SEEN 0 - 5 RBC/hpf   Bacteria, UA NONE SEEN NONE SEEN   Mucus PRESENT   Comprehensive metabolic panel  Result Value Ref Range   Sodium 137 135 - 145 mmol/L   Potassium 4.3 3.5 - 5.1 mmol/L   Chloride 103 98 - 111 mmol/L   CO2 26 22 - 32 mmol/L   Glucose, Bld 104 (H) 70 - 99 mg/dL   BUN 16 6 - 20 mg/dL   Creatinine, Ser 8.75 0.61 - 1.24 mg/dL   Calcium 8.9 8.9 - 64.3 mg/dL   Total Protein 8.0 6.5 - 8.1 g/dL   Albumin 4.8 3.5 - 5.0 g/dL   AST 23 15 - 41 U/L   ALT 26 0 - 44 U/L    Alkaline Phosphatase 57 38 - 126 U/L   Total Bilirubin 0.8 0.3 - 1.2 mg/dL   GFR, Estimated >32 >95 mL/min   Anion gap 8 5 - 15  Lipase, blood  Result Value Ref Range   Lipase 35 11 - 51 U/L  CBC with Differential  Result Value Ref Range   WBC 5.9 4.0 - 10.5 K/uL   RBC 5.72 4.22 - 5.81 MIL/uL   Hemoglobin 17.3 (H) 13.0 - 17.0 g/dL   HCT 18.8 41.6 - 60.6 %   MCV 86.4 80.0 - 100.0 fL   MCH 30.2 26.0 - 34.0 pg   MCHC 35.0 30.0 - 36.0 g/dL   RDW 30.1 60.1 - 09.3 %   Platelets 224 150 - 400 K/uL   nRBC 0.0 0.0 - 0.2 %   Neutrophils Relative % 69 %   Neutro Abs 4.0 1.7 - 7.7 K/uL   Lymphocytes Relative 17 %   Lymphs Abs 1.0 0.7 - 4.0 K/uL   Monocytes Relative 13 %   Monocytes Absolute 0.8 0.1 - 1.0 K/uL   Eosinophils Relative 1 %   Eosinophils Absolute 0.0 0.0 - 0.5 K/uL   Basophils Relative 0 %   Basophils Absolute 0.0 0.0 - 0.1 K/uL   Immature Granulocytes 0 %   Abs Immature Granulocytes 0.01 0.00 - 0.07 K/uL  Amylase  Result Value Ref Range   Amylase 57 28 - 100 U/L      Assessment & Plan:   1. Attention deficit  - atomoxetine (STRATTERA) 80 MG capsule; Take 1 capsule (80 mg total) by mouth daily.  Dispense: 90 capsule; Refill: 1   Follow up plan: Return in about 6 months (around 11/20/2021) for follow up.

## 2021-05-28 DIAGNOSIS — M5416 Radiculopathy, lumbar region: Secondary | ICD-10-CM | POA: Diagnosis not present

## 2021-05-28 NOTE — Telephone Encounter (Signed)
Lvmto call and schedule an appt for refills on meds

## 2021-06-05 DIAGNOSIS — M5416 Radiculopathy, lumbar region: Secondary | ICD-10-CM | POA: Diagnosis not present

## 2021-10-18 ENCOUNTER — Ambulatory Visit: Payer: BC Managed Care – PPO | Admitting: Nurse Practitioner

## 2021-10-18 ENCOUNTER — Encounter: Payer: Self-pay | Admitting: Nurse Practitioner

## 2021-10-18 ENCOUNTER — Other Ambulatory Visit: Payer: Self-pay

## 2021-10-18 VITALS — BP 118/72 | HR 96 | Temp 98.2°F | Resp 18 | Ht 73.0 in | Wt 222.9 lb

## 2021-10-18 DIAGNOSIS — R41840 Attention and concentration deficit: Secondary | ICD-10-CM | POA: Diagnosis not present

## 2021-10-18 DIAGNOSIS — R1032 Left lower quadrant pain: Secondary | ICD-10-CM | POA: Diagnosis not present

## 2021-10-18 MED ORDER — ATOMOXETINE HCL 80 MG PO CAPS: 80.0000 mg | ORAL_CAPSULE | Freq: Every day | ORAL | 1 refills | Status: DC

## 2021-10-18 NOTE — Progress Notes (Signed)
? ?BP 118/72   Pulse 96   Temp 98.2 ?F (36.8 ?C) (Oral)   Resp 18   Ht 6\' 1"  (1.854 m)   Wt 222 lb 14.4 oz (101.1 kg)   SpO2 98%   BMI 29.41 kg/m?   ? ?Subjective:  ? ? Patient ID: Ryan Roberts, male    DOB: 1990/10/28, 31 y.o.   MRN: [id number] ? ?HPI: ?Ryan Roberts is a 31 y.o. male ? ?Chief Complaint  ?Patient presents with  ? Abdominal Pain  ?   Possible hernia, Felt a bulge in abdomen since Friday  ? ?Left lower quadrant abdominal pain: Friday evening the was lifting dumbbells and had a sharp pain on the left side pain, he says that he noticed a lump on the left lower quadrant close to his bellybutton.  He says the lump then went away.  He denies any nausea vomiting diarrhea or constipation.  He denies any fever.  He says the pain was sharp but is now more like a dull ache.  He says he takes ibuprofen for pain.  Aggravating factors are when he picks up his son.  We discussed getting a CT to see if it is a hernia.  Patient is agreeable to plan. ? ?ADD: Currently takes Strattera 80 mg daily.  He says he is doing really well on this dose.  He is able to focus and get his things done.  He denies any complications.  We will continue with current plan. ? ?Relevant past medical, surgical, family and social history reviewed and updated as indicated. Interim medical history since our last visit reviewed. ?Allergies and medications reviewed and updated. ? ?Review of Systems ? ?Constitutional: Negative for fever or weight change.  ?Respiratory: Negative for cough and shortness of breath.   ?Cardiovascular: Negative for chest pain or palpitations.  ?Gastrointestinal: Positive for abdominal pain, no bowel changes.  ?Musculoskeletal: Negative for gait problem or joint swelling.  ?Skin: Negative for rash.  ?Neurological: Negative for dizziness or headache.  ?No other specific complaints in a complete review of systems (except as listed in HPI above).  ? ?   ?Objective:  ?  ?BP 118/72   Pulse 96   Temp 98.2 ?F  (36.8 ?C) (Oral)   Resp 18   Ht 6\' 1"  (1.854 m)   Wt 222 lb 14.4 oz (101.1 kg)   SpO2 98%   BMI 29.41 kg/m?   ?Wt Readings from Last 3 Encounters:  ?10/18/21 222 lb 14.4 oz (101.1 kg)  ?05/23/21 224 lb 1.6 oz (101.7 kg)  ?11/21/20 211 lb 14.4 oz (96.1 kg)  ?  ?Physical Exam ? ?Constitutional: Patient appears well-developed and well-nourished.  No distress.  ?HEENT: head atraumatic, normocephalic, pupils equal and reactive to light, neck supple ?Cardiovascular: Normal rate, regular rhythm and normal heart sounds.  No murmur heard. No BLE edema. ?Pulmonary/Chest: Effort normal and breath sounds normal. No respiratory distress. ?Abdominal: Soft.  There is no tenderness. Noted lump on left side of umbilicus in LLQ about the size of a quarter ?Psychiatric: Patient has a normal mood and affect. behavior is normal. Judgment and thought content normal.  ?Results for orders placed or performed during the hospital encounter of 09/21/20  ?Urinalysis, Complete w Microscopic Urine, Clean Catch  ?Result Value Ref Range  ? Color, Urine YELLOW YELLOW  ? APPearance CLEAR CLEAR  ? Specific Gravity, Urine >1.030 (H) 1.005 - 1.030  ? pH 5.5 5.0 - 8.0  ? Glucose, UA NEGATIVE NEGATIVE mg/dL  ?  Hgb urine dipstick NEGATIVE NEGATIVE  ? Bilirubin Urine SMALL (A) NEGATIVE  ? Ketones, ur NEGATIVE NEGATIVE mg/dL  ? Protein, ur TRACE (A) NEGATIVE mg/dL  ? Nitrite NEGATIVE NEGATIVE  ? Leukocytes,Ua NEGATIVE NEGATIVE  ? Squamous Epithelial / LPF NONE SEEN 0 - 5  ? WBC, UA 0-5 0 - 5 WBC/hpf  ? RBC / HPF NONE SEEN 0 - 5 RBC/hpf  ? Bacteria, UA NONE SEEN NONE SEEN  ? Mucus PRESENT   ?Comprehensive metabolic panel  ?Result Value Ref Range  ? Sodium 137 135 - 145 mmol/L  ? Potassium 4.3 3.5 - 5.1 mmol/L  ? Chloride 103 98 - 111 mmol/L  ? CO2 26 22 - 32 mmol/L  ? Glucose, Bld 104 (H) 70 - 99 mg/dL  ? BUN 16 6 - 20 mg/dL  ? Creatinine, Ser 0.95 0.61 - 1.24 mg/dL  ? Calcium 8.9 8.9 - 10.3 mg/dL  ? Total Protein 8.0 6.5 - 8.1 g/dL  ? Albumin 4.8 3.5 -  5.0 g/dL  ? AST 23 15 - 41 U/L  ? ALT 26 0 - 44 U/L  ? Alkaline Phosphatase 57 38 - 126 U/L  ? Total Bilirubin 0.8 0.3 - 1.2 mg/dL  ? GFR, Estimated >60 >60 mL/min  ? Anion gap 8 5 - 15  ?Lipase, blood  ?Result Value Ref Range  ? Lipase 35 11 - 51 U/L  ?CBC with Differential  ?Result Value Ref Range  ? WBC 5.9 4.0 - 10.5 K/uL  ? RBC 5.72 4.22 - 5.81 MIL/uL  ? Hemoglobin 17.3 (H) 13.0 - 17.0 g/dL  ? HCT 49.4 39.0 - 52.0 %  ? MCV 86.4 80.0 - 100.0 fL  ? MCH 30.2 26.0 - 34.0 pg  ? MCHC 35.0 30.0 - 36.0 g/dL  ? RDW 11.5 11.5 - 15.5 %  ? Platelets 224 150 - 400 K/uL  ? nRBC 0.0 0.0 - 0.2 %  ? Neutrophils Relative % 69 %  ? Neutro Abs 4.0 1.7 - 7.7 K/uL  ? Lymphocytes Relative 17 %  ? Lymphs Abs 1.0 0.7 - 4.0 K/uL  ? Monocytes Relative 13 %  ? Monocytes Absolute 0.8 0.1 - 1.0 K/uL  ? Eosinophils Relative 1 %  ? Eosinophils Absolute 0.0 0.0 - 0.5 K/uL  ? Basophils Relative 0 %  ? Basophils Absolute 0.0 0.0 - 0.1 K/uL  ? Immature Granulocytes 0 %  ? Abs Immature Granulocytes 0.01 0.00 - 0.07 K/uL  ?Amylase  ?Result Value Ref Range  ? Amylase 57 28 - 100 U/L  ? ?   ?Assessment & Plan:  ? ?1. Left lower quadrant abdominal pain ?CBC CMP ?- CT Abdomen Pelvis W Contrast; Future ?Plan is to get labs CT scan refer to surgery if positive for hernia ? ?2. Attention deficit ?Continue current treatment ?- atomoxetine (STRATTERA) 80 MG capsule; Take 1 capsule (80 mg total) by mouth daily.  Dispense: 90 capsule; Refill: 1 ? ? ?Follow up plan: ?Return in about 6 months (around 04/19/2022) for follow up. ? ? ? ? ? ?

## 2021-10-19 LAB — COMPLETE METABOLIC PANEL WITH GFR
AG Ratio: 1.8 (calc) (ref 1.0–2.5)
ALT: 29 U/L (ref 9–46)
AST: 20 U/L (ref 10–40)
Albumin: 4.9 g/dL (ref 3.6–5.1)
Alkaline phosphatase (APISO): 64 U/L (ref 36–130)
BUN: 20 mg/dL (ref 7–25)
CO2: 24 mmol/L (ref 20–32)
Calcium: 9.4 mg/dL (ref 8.6–10.3)
Chloride: 105 mmol/L (ref 98–110)
Creat: 0.97 mg/dL (ref 0.60–1.26)
Globulin: 2.8 g/dL (calc) (ref 1.9–3.7)
Glucose, Bld: 92 mg/dL (ref 65–99)
Potassium: 4.5 mmol/L (ref 3.5–5.3)
Sodium: 139 mmol/L (ref 135–146)
Total Bilirubin: 0.7 mg/dL (ref 0.2–1.2)
Total Protein: 7.7 g/dL (ref 6.1–8.1)
eGFR: 106 mL/min/{1.73_m2} (ref >=60)

## 2021-10-19 LAB — CBC WITH DIFFERENTIAL/PLATELET
Absolute Monocytes: 440 cells/uL (ref 200–950)
Basophils Absolute: 39 cells/uL (ref 0–200)
Basophils Relative: 0.7 %
Eosinophils Absolute: 77 cells/uL (ref 15–500)
Eosinophils Relative: 1.4 %
HCT: 48.9 % (ref 38.5–50.0)
Hemoglobin: 16.9 g/dL (ref 13.2–17.1)
Lymphs Abs: 1375 cells/uL (ref 850–3900)
MCH: 30.7 pg (ref 27.0–33.0)
MCHC: 34.6 g/dL (ref 32.0–36.0)
MCV: 88.7 fL (ref 80.0–100.0)
MPV: 11.8 fL (ref 7.5–12.5)
Monocytes Relative: 8 %
Neutro Abs: 3570 cells/uL (ref 1500–7800)
Neutrophils Relative %: 64.9 %
Platelets: 269 10*3/uL (ref 140–400)
RBC: 5.51 10*6/uL (ref 4.20–5.80)
RDW: 12.2 % (ref 11.0–15.0)
Total Lymphocyte: 25 %
WBC: 5.5 10*3/uL (ref 3.8–10.8)

## 2021-10-26 ENCOUNTER — Telehealth: Payer: Self-pay

## 2021-10-26 NOTE — Telephone Encounter (Signed)
Copied from [place] [id number]. Topic: General - Other >> Oct 23, 2021  9:18 AM Valere Dross wrote: Reason for CRM: Pt called in about his CT, stating he is wants his CT scan referral sent over to [facility], please advise. >> Oct 23, 2021 11:44 AM [place], Tawanna Solo, CMA wrote: This was sent to the wrong practice.  Thanks

## 2021-10-26 NOTE — Telephone Encounter (Signed)
Pt called back for status update on request  ?

## 2021-10-29 NOTE — Telephone Encounter (Signed)
Order faxed to [facility] per speaking to patient at [phone] ?

## 2021-10-29 NOTE — Telephone Encounter (Signed)
Spoke to Ryan Roberts and she stated patient need to call for appointment. Everything has been sent from referral department ? ?

## 2021-11-02 DIAGNOSIS — R1032 Left lower quadrant pain: Secondary | ICD-10-CM | POA: Diagnosis not present

## 2021-11-06 ENCOUNTER — Encounter: Payer: Self-pay | Admitting: Nurse Practitioner

## 2021-11-12 ENCOUNTER — Encounter: Payer: Self-pay | Admitting: Nurse Practitioner

## 2021-11-12 ENCOUNTER — Other Ambulatory Visit: Payer: Self-pay

## 2021-11-12 ENCOUNTER — Ambulatory Visit: Payer: BC Managed Care – PPO | Admitting: Nurse Practitioner

## 2021-11-12 VITALS — BP 124/78 | HR 95 | Temp 98.5°F | Resp 18 | Ht 73.0 in | Wt 224.6 lb

## 2021-11-12 DIAGNOSIS — R1032 Left lower quadrant pain: Secondary | ICD-10-CM

## 2021-11-12 DIAGNOSIS — R41840 Attention and concentration deficit: Secondary | ICD-10-CM | POA: Diagnosis not present

## 2021-11-12 MED ORDER — NAPROXEN 500 MG PO TABS
500.0000 mg | ORAL_TABLET | Freq: Two times a day (BID) | ORAL | 0 refills | Status: DC
Start: 2021-11-12 — End: 2021-12-06

## 2021-11-12 MED ORDER — ATOMOXETINE HCL 80 MG PO CAPS: 80.0000 mg | ORAL_CAPSULE | Freq: Every day | ORAL | 1 refills | Status: DC

## 2021-11-12 MED ORDER — METHOCARBAMOL 500 MG PO TABS: 500.0000 mg | ORAL_TABLET | Freq: Two times a day (BID) | ORAL | 0 refills | Status: DC | PRN

## 2021-11-12 NOTE — Assessment & Plan Note (Signed)
She is doing well taking Strattera 80 mg daily.  Refill sent ?

## 2021-11-12 NOTE — Progress Notes (Signed)
? ?BP 124/78   Pulse 95   Temp 98.5 ?F (36.9 ?C) (Oral)   Resp 18   Ht 6' 1"  (1.854 m)   Wt 224 lb 9.6 oz (101.9 kg)   SpO2 98%   BMI 29.63 kg/m?   ? ?Subjective:  ? ? Patient ID: Ryan Roberts, male    DOB: [birth year], 31 y.o.   MRN: [id number] ? ?HPI: ?Banner Huckaba is a 31 y.o. male ? ?Chief Complaint  ?Patient presents with  ? Hernia  ? Sinusitis  ?  For 5 days, 2 negative covid test  ? ?Left lower quadrant pain: She was seen on 10/18/2021.  He said the Friday before he was lifting dumbbells and had a sharp pain on the left side.  He stated that he noticed a lump on the left lower quadrant close to his bellybutton.  He says the lump then went away.  No nausea, vomiting, diarrhea or constipation.  He denies any fever.  He says the pain was sharp but is now more like a dull ache.  He says he takes Tylenol for pain.  Aggravating factors have been when he goes to pick up his son.  He says he has not been picking up his son to prevent the pain.  CT scan like we had discussed at his last appointment.  CT scan was negative.  Patient reports that he continues to have pain with movement or lifting things.  Discussed that possibly is musculoskeletal.  Will give naproxen and Robaxin to see if that helps.  Discussed with patient that if he has no improvement to reach back out.  ? ?URI: Patient reports that he has not had nasal congestion, runny nose and cough for 5 days.  He has had 2 negative COVID tests.  He is taking over-the-counter cough cold medicine.  He says he is doing well with that.  Continue to treat symptoms with over-the-counter medication.  Push fluids and get rest. ? ?ADD: Patient currently takes Strattera 80 mg daily.  He says he is doing really well on this dose.  He is able to focus and get his things done.  He denies any complications.  We will continue with current plan.  Refill sent. ? ?Relevant past medical, surgical, family and social history reviewed and updated as indicated. Interim  medical history since our last visit reviewed. ?Allergies and medications reviewed and updated. ? ?Review of Systems ? ?Constitutional: Negative for fever or weight change.  ?Respiratory: Negative for cough and shortness of breath.   ?Cardiovascular: Negative for chest pain or palpitations.  ?Gastrointestinal: positive for abdominal pain, no bowel changes.  ?Musculoskeletal: Negative for gait problem or joint swelling.  ?Skin: Negative for rash.  ?Neurological: Negative for dizziness or headache.  ?No other specific complaints in a complete review of systems (except as listed in HPI above).  ? ?   ?Objective:  ?  ?BP 124/78   Pulse 95   Temp 98.5 ?F (36.9 ?C) (Oral)   Resp 18   Ht 6' 1"  (1.854 m)   Wt 224 lb 9.6 oz (101.9 kg)   SpO2 98%   BMI 29.63 kg/m?   ?Wt Readings from Last 3 Encounters:  ?11/12/21 224 lb 9.6 oz (101.9 kg)  ?10/18/21 222 lb 14.4 oz (101.1 kg)  ?05/23/21 224 lb 1.6 oz (101.7 kg)  ?  ?Physical Exam ? ?Constitutional: Patient appears well-developed and well-nourished. Overweight No distress.  ?HEENT: head atraumatic, normocephalic, pupils equal and reactive to light,  neck  supple ?Cardiovascular: Normal rate, regular rhythm and normal heart sounds.  No murmur heard. No BLE edema. ?Pulmonary/Chest: Effort normal and breath sounds normal. No respiratory distress. ?Abdominal: Soft.  Mild tenderness in the left lower quadrant ?Psychiatric: Patient has a normal mood and affect. behavior is normal. Judgment and thought content normal.  ? ?Results for orders placed or performed in visit on 10/18/21  ?CBC with Differential/Platelet  ?Result Value Ref Range  ? WBC 5.5 3.8 - 10.8 Thousand/uL  ? RBC 5.51 4.20 - 5.80 Million/uL  ? Hemoglobin 16.9 13.2 - 17.1 g/dL  ? HCT 48.9 38.5 - 50.0 %  ? MCV 88.7 80.0 - 100.0 fL  ? MCH 30.7 27.0 - 33.0 pg  ? MCHC 34.6 32.0 - 36.0 g/dL  ? RDW 12.2 11.0 - 15.0 %  ? Platelets 269 140 - 400 Thousand/uL  ? MPV 11.8 7.5 - 12.5 fL  ? Neutro Abs 3,570 1,500 - 7,800  cells/uL  ? Lymphs Abs 1,375 850 - 3,900 cells/uL  ? Absolute Monocytes 440 200 - 950 cells/uL  ? Eosinophils Absolute 77 15 - 500 cells/uL  ? Basophils Absolute 39 0 - 200 cells/uL  ? Neutrophils Relative % 64.9 %  ? Total Lymphocyte 25.0 %  ? Monocytes Relative 8.0 %  ? Eosinophils Relative 1.4 %  ? Basophils Relative 0.7 %  ?COMPLETE METABOLIC PANEL WITH GFR  ?Result Value Ref Range  ? Glucose, Bld 92 65 - 99 mg/dL  ? BUN 20 7 - 25 mg/dL  ? Creat 0.97 0.60 - 1.26 mg/dL  ? eGFR 106 > OR = 60 mL/min/1.51m  ? BUN/Creatinine Ratio NOT APPLICABLE 6 - 22 (calc)  ? Sodium 139 135 - 146 mmol/L  ? Potassium 4.5 3.5 - 5.3 mmol/L  ? Chloride 105 98 - 110 mmol/L  ? CO2 24 20 - 32 mmol/L  ? Calcium 9.4 8.6 - 10.3 mg/dL  ? Total Protein 7.7 6.1 - 8.1 g/dL  ? Albumin 4.9 3.6 - 5.1 g/dL  ? Globulin 2.8 1.9 - 3.7 g/dL (calc)  ? AG Ratio 1.8 1.0 - 2.5 (calc)  ? Total Bilirubin 0.7 0.2 - 1.2 mg/dL  ? Alkaline phosphatase (APISO) 64 36 - 130 U/L  ? AST 20 10 - 40 U/L  ? ALT 29 9 - 46 U/L  ? ?   ?Assessment & Plan:  ? ?Problem List Items Addressed This Visit   ? ?  ? Other  ? Attention deficit  ?  She is doing well taking Strattera 80 mg daily.  Refill sent ? ?  ?  ? Relevant Medications  ? atomoxetine (STRATTERA) 80 MG capsule  ? ?Other Visit Diagnoses   ? ? Left lower quadrant abdominal pain    -  Primary  ? CT scan negative, lab work normal.  Treat as MSK, take Robaxin and naproxen.  Reach out if no improvement.  ? Relevant Medications  ? methocarbamol (ROBAXIN) 500 MG tablet  ? naproxen (NAPROSYN) 500 MG tablet  ? ?  ?  ? ?Follow up plan: ?Return if symptoms worsen or fail to improve. ? ? ? ? ? ?

## 2021-11-15 ENCOUNTER — Encounter: Payer: Self-pay | Admitting: Nurse Practitioner

## 2021-11-15 ENCOUNTER — Other Ambulatory Visit: Payer: Self-pay | Admitting: Nurse Practitioner

## 2021-11-15 DIAGNOSIS — R1032 Left lower quadrant pain: Secondary | ICD-10-CM

## 2021-11-19 ENCOUNTER — Encounter: Payer: Self-pay | Admitting: Family Medicine

## 2021-11-19 ENCOUNTER — Ambulatory Visit
Admission: RE | Admit: 2021-11-19 | Discharge: 2021-11-19 | Disposition: A | Discharge status: Home / Self Care | Payer: BC Managed Care – PPO | Attending: Family Medicine | Admitting: Family Medicine

## 2021-11-19 ENCOUNTER — Ambulatory Visit: Payer: BC Managed Care – PPO | Admitting: Family Medicine

## 2021-11-19 ENCOUNTER — Ambulatory Visit
Admission: RE | Admit: 2021-11-19 | Discharge: 2021-11-19 | Disposition: A | Discharge status: Home / Self Care | Payer: BC Managed Care – PPO | Source: Ambulatory Visit | Attending: Family Medicine | Admitting: Family Medicine

## 2021-11-19 VITALS — BP 128/78 | HR 99 | Ht 73.0 in | Wt 224.4 lb

## 2021-11-19 DIAGNOSIS — R1032 Left lower quadrant pain: Secondary | ICD-10-CM | POA: Diagnosis not present

## 2021-11-19 DIAGNOSIS — M5137 Other intervertebral disc degeneration, lumbosacral region: Secondary | ICD-10-CM | POA: Diagnosis not present

## 2021-11-19 DIAGNOSIS — G8929 Other chronic pain: Secondary | ICD-10-CM | POA: Diagnosis not present

## 2021-11-19 DIAGNOSIS — M5136 Other intervertebral disc degeneration, lumbar region: Secondary | ICD-10-CM | POA: Diagnosis not present

## 2021-11-19 DIAGNOSIS — M4317 Spondylolisthesis, lumbosacral region: Secondary | ICD-10-CM | POA: Diagnosis not present

## 2021-11-19 MED ORDER — MELOXICAM 15 MG PO TABS: 15.0000 mg | ORAL_TABLET | Freq: Every day | ORAL | 0 refills | Status: DC

## 2021-11-19 MED ORDER — CYCLOBENZAPRINE HCL 10 MG PO TABS: 10.0000 mg | ORAL_TABLET | Freq: Every day | ORAL | 0 refills | Status: DC

## 2021-11-19 NOTE — Assessment & Plan Note (Addendum)
Patient presenting with acute onset of left lower abdominal left inguinal pain following lifting weights, had sharp pain to the left umbilical region where he noticed a lump, that is since resolved, no GI symptoms, has been afebrile, initially sharp now more dull and burning localized to the left abdominal left inguinal region, aggravated by lifting, physical activities, resolves with rest.  No change in symptoms after a bowel movement, no urinary symptoms, no flank pain. Of note he has an extensive history of low back pain primarily in the lower lumbar region, has necessitated cortisone injections through [facility], this has been a constant issue with no significant change during the after mentioned timeframe.  He had a CT abdomen pelvis with contrast done through his PCP Della Goo, FNP which was negative.  Examination reveals negative straight leg raise, positive Faber, positive FADIR, negative piriformis testing, resisted hip flexion equivocal, tenderness that nearly recreates his stated symptomatology along the proximal abductor tendon on the left, abdominal and inguinal areas without masses, Valsalva does not bring about masses, abdomen soft with mild tenderness along the left inferior rectus abdominis, engages muscles does recreate his stated symptoms to a lesser extent.  History, examination, and findings today raise concern for etiologies inclusive of lumbosacral referred pain primarily in the L1-2 dermatomal distribution, abductor strain/sports hernia, and cannot exclude abdominal/inguinal hernia.  Plan for ultrasound studies to evaluate for the latter, dedicated lumbar spine x-ray, will initiate scheduled meloxicam, as needed cyclobenzaprine, and gentle home exercises.  Return for follow-up in 2 weeks.  CT scan results reviewed, 2 recent visit notes with PCP reviewed

## 2021-11-19 NOTE — Patient Instructions (Signed)
-   Obtain x-ray - Scheduler will contact in regards to setting up ultrasound - Dose meloxicam once daily with food (no other NSAIDs on this medication) - Can trial nightly cyclobenzaprine, side effect and be drowsiness - Start home exercises and focus on slow progression of symptoms as a guide - Return for follow-up in 2 weeks, contact for any questions between now and then

## 2021-11-19 NOTE — Progress Notes (Signed)
Primary Care / Sports Medicine Office Visit  Patient Information:  Patient ID: Ryan Roberts, male DOB: [birth year] Age: 31 y.o. MRN: [id number]   Ryan Roberts is a pleasant 31 y.o. male presenting with the following:  Chief Complaint  Patient presents with   Left lower abdominal pain    Left lower muscle strain for 1 month, was picking weights up felt sharp pain.     Vitals:   11/19/21 1022  BP: 128/78  Pulse: 99  SpO2: 99%   Vitals:   11/19/21 1022  Weight: 224 lb 6.4 oz (101.8 kg)  Height: 6\' 1"  (1.854 m)   Body mass index is 29.61 kg/m.  No results found.   Independent interpretation of notes and tests performed by another provider:   None  Procedures performed:   None  Pertinent History, Exam, Impression, and Recommendations:   Problem List Items Addressed This Visit       Other   Left inguinal pain - Primary    Patient presenting with acute onset of left lower abdominal left inguinal pain following lifting weights, had sharp pain to the left umbilical region where he noticed a lump, that is since resolved, no GI symptoms, has been afebrile, initially sharp now more dull and burning localized to the left abdominal left inguinal region, aggravated by lifting, physical activities, resolves with rest.  No change in symptoms after a bowel movement, no urinary symptoms, no flank pain. Of note he has an extensive history of low back pain primarily in the lower lumbar region, has necessitated cortisone injections through [facility], this has been a constant issue with no significant change during the after mentioned timeframe.  He had a CT abdomen pelvis with contrast done through his PCP , FNP which was negative.  Examination reveals negative straight leg raise, positive Faber, positive FADIR, negative piriformis testing, resisted hip flexion equivocal, tenderness that nearly recreates his stated symptomatology along the proximal abductor tendon on the  left, abdominal and inguinal areas without masses, Valsalva does not bring about masses, abdomen soft with mild tenderness along the left inferior rectus abdominis, engages muscles does recreate his stated symptoms to a lesser extent.  History, examination, and findings today raise concern for etiologies inclusive of lumbosacral referred pain primarily in the L1-2 dermatomal distribution, abductor strain/sports hernia, and cannot exclude abdominal/inguinal hernia.  Plan for ultrasound studies to evaluate for the latter, dedicated lumbar spine x-ray, will initiate scheduled meloxicam, as needed cyclobenzaprine, and gentle home exercises.  Return for follow-up in 2 weeks.  CT scan results reviewed, 2 recent visit notes with PCP reviewed       Relevant Medications   meloxicam (MOBIC) 15 MG tablet   cyclobenzaprine (FLEXERIL) 10 MG tablet   Other Relevant Orders   DG Lumbar Spine Complete   Della Goo LT LOWER EXTREM LTD SOFT TISSUE NON VASCULAR     Orders & Medications Meds ordered this encounter  Medications   meloxicam (MOBIC) 15 MG tablet    Sig: Take 1 tablet (15 mg total) by mouth daily.    Dispense:  14 tablet    Refill:  0   cyclobenzaprine (FLEXERIL) 10 MG tablet    Sig: Take 1 tablet (10 mg total) by mouth at bedtime.    Dispense:  14 tablet    Refill:  0   Orders Placed This Encounter  Procedures   DG Lumbar Spine Complete   US LT LOWER EXTREM LTD SOFT TISSUE NON VASCULAR  Return in about 2 weeks (around 12/03/2021).     Jerrol Banana, MD   Primary Care Sports Medicine [facility]

## 2021-11-20 ENCOUNTER — Ambulatory Visit: Payer: BC Managed Care – PPO | Admitting: Nurse Practitioner

## 2021-11-20 ENCOUNTER — Other Ambulatory Visit: Payer: Self-pay | Admitting: Nurse Practitioner

## 2021-11-20 DIAGNOSIS — R1032 Left lower quadrant pain: Secondary | ICD-10-CM

## 2021-11-21 ENCOUNTER — Other Ambulatory Visit: Payer: Self-pay

## 2021-11-21 NOTE — Telephone Encounter (Signed)
Requested medication (s) are due for refill today: yes  Requested medication (s) are on the active medication list: yes  Last refill:  11/12/21  Future visit scheduled: no  Notes to clinic:  Unable to refill per protocol due duplicate request. Medication was refilled 11/12/21 by PCP.     Requested Prescriptions  Pending Prescriptions Disp Refills   naproxen (NAPROSYN) 500 MG tablet [Pharmacy Med Name: NAPROXEN 500MG TABLETS] 20 tablet 0    Sig: TAKE 1 TABLET(500 MG) BY MOUTH TWICE DAILY WITH A MEAL     Analgesics:  NSAIDS Failed - 11/20/2021  8:17 AM      Failed - Manual Review: Labs are only required if the patient has taken medication for more than 8 weeks.      Passed - Cr in normal range and within 360 days    Creat  Date Value Ref Range Status  10/18/2021 0.97 0.60 - 1.26 mg/dL Final         Passed - HGB in normal range and within 360 days    Hemoglobin  Date Value Ref Range Status  10/18/2021 16.9 13.2 - 17.1 g/dL Final         Passed - PLT in normal range and within 360 days    Platelets  Date Value Ref Range Status  10/18/2021 269 140 - 400 Thousand/uL Final         Passed - HCT in normal range and within 360 days    HCT  Date Value Ref Range Status  10/18/2021 48.9 38.5 - 50.0 % Final         Passed - eGFR is 30 or above and within 360 days    GFR, Est African American  Date Value Ref Range Status  09/24/2019 114 > OR = 60 mL/min/1.32m Final   GFR, Est Non African American  Date Value Ref Range Status  09/24/2019 98 > OR = 60 mL/min/1.723mFinal   GFR, Estimated  Date Value Ref Range Status  09/21/2020 >60 >60 mL/min Final    Comment:    (NOTE) Calculated using the CKD-EPI Creatinine Equation (2021)    eGFR  Date Value Ref Range Status  10/18/2021 106 > OR = 60 mL/min/1.7366minal    Comment:    The eGFR is based on the CKD-EPI 2021 equation. To calculate  the new eGFR from a previous Creatinine or Cystatin C result, go to  https://www.kidney.org/professionals/ kdoqi/gfr%5Fcalculator          Passed - Patient is not pregnant      Passed - Valid encounter within last 12 months    Recent Outpatient Visits           2 days ago Left inguinal pain   [facility]   1 week ago Left lower quadrant abdominal pain   [facility]   1 month ago Left lower quadrant abdominal pain   [facility]   6 months ago Attention deficit   [facility]   1 year ago Attention deficit   [facility]       Future Appointments             In 2 weeks MatZigmund DanielasEarley AbideD [facility]In [facility] PenReece PackerulMyna HidalgoNP[facility]

## 2021-11-24 ENCOUNTER — Other Ambulatory Visit: Payer: Self-pay

## 2021-11-24 ENCOUNTER — Encounter: Payer: Self-pay | Admitting: Emergency Medicine

## 2021-11-24 ENCOUNTER — Ambulatory Visit
Admission: EM | Admit: 2021-11-24 | Discharge: 2021-11-24 | Disposition: A | Discharge status: Home / Self Care | Payer: BC Managed Care – PPO | Attending: Physician Assistant | Admitting: Physician Assistant

## 2021-11-24 DIAGNOSIS — R051 Acute cough: Secondary | ICD-10-CM | POA: Insufficient documentation

## 2021-11-24 DIAGNOSIS — J069 Acute upper respiratory infection, unspecified: Secondary | ICD-10-CM | POA: Diagnosis not present

## 2021-11-24 DIAGNOSIS — J029 Acute pharyngitis, unspecified: Secondary | ICD-10-CM | POA: Diagnosis not present

## 2021-11-24 LAB — GROUP A STREP BY PCR: Group A Strep by PCR: NOT DETECTED

## 2021-11-24 MED ORDER — PROMETHAZINE-DM 6.25-15 MG/5ML PO SYRP: 5.0000 mL | ORAL_SOLUTION | Freq: Four times a day (QID) | ORAL | 0 refills | Status: DC | PRN

## 2021-11-24 NOTE — Discharge Instructions (Addendum)
-  Testing for strep.  We will call with positive.  If you do not hear from [place], it is negative and you likely have a virus that is taking on longer than normal.  If any symptoms worsen, please return for reevaluation.  URI/COLD SYMPTOMS: Your exam today is consistent with a viral illness. Antibiotics are not indicated at this time. Use medications as directed, including cough syrup, nasal saline, and decongestants. Your symptoms should improve over the next few days and resolve within another 7-10 days. Increase rest and fluids. F/u if symptoms worsen or predominate such as sore throat, ear pain, productive cough, shortness of breath, or if you develop high fevers or worsening fatigue over the next several days.

## 2021-11-24 NOTE — ED Provider Notes (Signed)
[facility]    CSN: [id number] Arrival date & time: 11/24/21  1123      History   Chief Complaint Chief Complaint  Patient presents with   Cough    HPI Ryan Roberts is a 31 y.o. male presenting for 3-week history of dry cough, left ear pressure, postnasal drainage and itchy and irritated throat.  Reports he might of had a fever at the first day but he has not had any continued fevers.  Denies nasal congestion, sinus pain, chest pain or breathing difficulty.  He says that his child and wife have been sick with similar symptoms about a week ago but they have improved and he has not.  He denies any improvement or worsening of symptoms.  Tried decongestants initially but has not taken anything recently.  Patient concerned about strep and would like a strep test.  No other complaints.  HPI  Past Medical History:  Diagnosis Date   Lumbar herniated disc     Patient Active Problem List   Diagnosis Date Noted   Left inguinal pain 11/19/2021   Overweight 11/19/2019   Mixed hyperlipidemia 11/19/2019   Attention deficit 09/24/2019   Lumbar disc disease 09/24/2019    Past Surgical History:  Procedure Laterality Date   TONSILLECTOMY         Home Medications    Prior to Admission medications   Medication Sig Start Date End Date Taking? Authorizing Provider  atomoxetine (STRATTERA) 80 MG capsule Take 1 capsule (80 mg total) by mouth daily. 11/12/21  Yes Berniece Salines, FNP  cyclobenzaprine (FLEXERIL) 10 MG tablet Take 1 tablet (10 mg total) by mouth at bedtime. 11/19/21  Yes Jerrol Banana, MD  meloxicam (MOBIC) 15 MG tablet Take 1 tablet (15 mg total) by mouth daily. 11/19/21  Yes Jerrol Banana, MD  promethazine-dextromethorphan (PROMETHAZINE-DM) 6.25-15 MG/5ML syrup Take 5 mLs by mouth 4 (four) times daily as needed. 11/24/21  Yes Shirlee Latch, PA-C  methocarbamol (ROBAXIN) 500 MG tablet Take 1 tablet (500 mg total) by mouth 2 (two) times daily as needed for  muscle spasms. Patient not taking: Reported on 11/19/2021 11/12/21   Berniece Salines, FNP  naproxen (NAPROSYN) 500 MG tablet Take 1 tablet (500 mg total) by mouth 2 (two) times daily with a meal. 11/12/21   Berniece Salines, FNP    Family History Family History  Problem Relation Age of Onset   Diabetes Mother    Cancer Father    ADD / ADHD Father    Colon cancer Maternal Grandmother    Heart attack Maternal Grandfather    Diabetes Maternal Grandfather    Cancer Paternal Grandmother    Lung cancer Paternal Grandfather     Social History Social History   Tobacco Use   Smoking status: Never   Smokeless tobacco: Never  Vaping Use   Vaping Use: Never used  Substance Use Topics   Alcohol use: No   Drug use: No     Allergies   No known allergies   Review of Systems Review of Systems  Constitutional:  Negative for fatigue and fever.  HENT:  Positive for ear pain, postnasal drip and sore throat. Negative for congestion, rhinorrhea, sinus pressure and sinus pain.   Respiratory:  Positive for cough. Negative for shortness of breath.   Gastrointestinal:  Negative for abdominal pain, diarrhea, nausea and vomiting.  Musculoskeletal:  Negative for myalgias.  Neurological:  Negative for weakness, light-headedness and headaches.  Hematological:  Negative  for adenopathy.    Physical Exam Triage Vital Signs ED Triage Vitals  Enc Vitals Group     BP 11/24/21 1136 (!) 142/94     Pulse Rate 11/24/21 1136 91     Resp 11/24/21 1136 15     Temp 11/24/21 1136 98.3 F (36.8 C)     Temp Source 11/24/21 1136 Oral     SpO2 11/24/21 1136 100 %     Weight 11/24/21 1133 225 lb (102.1 kg)     Height 11/24/21 1133 6\' 1"  (1.854 m)     Head Circumference --      Peak Flow --      Pain Score --      Pain Loc --      Pain Edu? --      Excl. in GC? --    No data found.  Updated Vital Signs BP (!) 142/94 (BP Location: Left Arm)   Pulse 91   Temp 98.3 F (36.8 C) (Oral)   Resp 15   Ht 6'  1" (1.854 m)   Wt 225 lb (102.1 kg)   SpO2 100%   BMI 29.69 kg/m    Physical Exam Vitals and nursing note reviewed.  Constitutional:      General: He is not in acute distress.    Appearance: Normal appearance. He is well-developed. He is not ill-appearing.  HENT:     Head: Normocephalic and atraumatic.     Right Ear: There is impacted cerumen.     Left Ear: Tympanic membrane, ear canal and external ear normal.     Nose: Nose normal.     Mouth/Throat:     Mouth: Mucous membranes are moist.     Pharynx: Oropharynx is clear. Posterior oropharyngeal erythema (mild with clear PND) present.  Eyes:     General: No scleral icterus.    Conjunctiva/sclera: Conjunctivae normal.  Cardiovascular:     Rate and Rhythm: Normal rate and regular rhythm.  Pulmonary:     Effort: Pulmonary effort is normal. No respiratory distress.     Breath sounds: Normal breath sounds.  Musculoskeletal:     Cervical back: Neck supple.  Skin:    General: Skin is warm and dry.     Capillary Refill: Capillary refill takes less than 2 seconds.  Neurological:     General: No focal deficit present.     Mental Status: He is alert. Mental status is at baseline.     Motor: No weakness.     Gait: Gait normal.  Psychiatric:        Mood and Affect: Mood normal.        Behavior: Behavior normal.        Thought Content: Thought content normal.     UC Treatments / Results  Labs (all labs ordered are listed, but only abnormal results are displayed) Labs Reviewed  GROUP A STREP BY PCR    EKG   Radiology No results found.  Procedures Procedures (including critical care time)  Medications Ordered in UC Medications - No data to display  Initial Impression / Assessment and Plan / UC Course  I have reviewed the triage vital signs and the nursing notes.  Pertinent labs & imaging results that were available during my care of the patient were reviewed by me and considered in my medical decision making (see  chart for details).  [age] year old male presenting for 3-week history of dry cough, postnasal drainage, left ear pressure.  No fevers, sinus pain, breathing  difficulty.  He is afebrile and overall well-appearing.  Exam significant for small amount of postnasal drainage.  No evidence of ear infection.  Chest clear to auscultation heart regular rate rhythm.  PCR strep test obtained.  Advised patient we will call him if it is positive but expect will likely be negative.  Advised him symptoms consistent with a viral upper respiratory infection versus allergies.  Sent Promethazine DM to pharmacy.  Encouraged him to increase rest and fluids.  Reviewed return and ER precautions.  Negative strep.   Final Clinical Impressions(s) / UC Diagnoses   Final diagnoses:  Acute upper respiratory infection  Acute cough  Sore throat     Discharge Instructions      -Testing for strep.  We will call with positive.  If you do not hear from [place], it is negative and you likely have a virus that is taking on longer than normal.  If any symptoms worsen, please return for reevaluation.  URI/COLD SYMPTOMS: Your exam today is consistent with a viral illness. Antibiotics are not indicated at this time. Use medications as directed, including cough syrup, nasal saline, and decongestants. Your symptoms should improve over the next few days and resolve within another 7-10 days. Increase rest and fluids. F/u if symptoms worsen or predominate such as sore throat, ear pain, productive cough, shortness of breath, or if you develop high fevers or worsening fatigue over the next several days.       ED Prescriptions     Medication Sig Dispense Auth. Provider   promethazine-dextromethorphan (PROMETHAZINE-DM) 6.25-15 MG/5ML syrup Take 5 mLs by mouth 4 (four) times daily as needed. 118 mL Shirlee Latch, PA-C      PDMP not reviewed this encounter.   Shirlee Latch, PA-C 11/24/21 1241

## 2021-11-24 NOTE — ED Triage Notes (Signed)
Patient reports cold symptoms that started 3 weeks ago.  Patient reports ongoing cough that is worse at night. Patient denies fevers.

## 2021-11-28 ENCOUNTER — Other Ambulatory Visit: Payer: Self-pay | Admitting: Family Medicine

## 2021-11-28 ENCOUNTER — Ambulatory Visit
Admission: RE | Admit: 2021-11-28 | Discharge: 2021-11-28 | Disposition: A | Discharge status: Home / Self Care | Payer: BC Managed Care – PPO | Source: Ambulatory Visit | Attending: Family Medicine | Admitting: Family Medicine

## 2021-11-28 DIAGNOSIS — R1032 Left lower quadrant pain: Secondary | ICD-10-CM

## 2021-11-28 DIAGNOSIS — R1030 Lower abdominal pain, unspecified: Secondary | ICD-10-CM | POA: Diagnosis not present

## 2021-12-03 ENCOUNTER — Other Ambulatory Visit: Payer: Self-pay | Admitting: Family Medicine

## 2021-12-03 DIAGNOSIS — R1032 Left lower quadrant pain: Secondary | ICD-10-CM

## 2021-12-04 NOTE — Telephone Encounter (Signed)
Requested medication (s) are due for refill today: yes  Requested medication (s) are on the active medication list: yes  Last refill:  11/19/21 #14  Future visit scheduled: yes  Notes to clinic:  pt needing a refill to last until appt- was hesitant to refill due to limited amount that was prescribed by PCP   Requested Prescriptions  Pending Prescriptions Disp Refills   meloxicam (MOBIC) 15 MG tablet [Pharmacy Med Name: MELOXICAM 15MG TABLETS] 14 tablet 0    Sig: TAKE 1 TABLET(15 MG) BY MOUTH DAILY     Analgesics:  COX2 Inhibitors Failed - 12/03/2021  8:08 AM      Failed - Manual Review: Labs are only required if the patient has taken medication for more than 8 weeks.      Passed - HGB in normal range and within 360 days    Hemoglobin  Date Value Ref Range Status  10/18/2021 16.9 13.2 - 17.1 g/dL Final         Passed - Cr in normal range and within 360 days    Creat  Date Value Ref Range Status  10/18/2021 0.97 0.60 - 1.26 mg/dL Final         Passed - HCT in normal range and within 360 days    HCT  Date Value Ref Range Status  10/18/2021 48.9 38.5 - 50.0 % Final         Passed - AST in normal range and within 360 days    AST  Date Value Ref Range Status  10/18/2021 20 10 - 40 U/L Final         Passed - ALT in normal range and within 360 days    ALT  Date Value Ref Range Status  10/18/2021 29 9 - 46 U/L Final         Passed - eGFR is 30 or above and within 360 days    GFR, Est African American  Date Value Ref Range Status  09/24/2019 114 > OR = 60 mL/min/1.3m Final   GFR, Est Non African American  Date Value Ref Range Status  09/24/2019 98 > OR = 60 mL/min/1.719mFinal   GFR, Estimated  Date Value Ref Range Status  09/21/2020 >60 >60 mL/min Final    Comment:    (NOTE) Calculated using the CKD-EPI Creatinine Equation (2021)    eGFR  Date Value Ref Range Status  10/18/2021 106 > OR = 60 mL/min/1.7352minal    Comment:    The eGFR is based on the  CKD-EPI 2021 equation. To calculate  the new eGFR from a previous Creatinine or Cystatin C result, go to https://www.kidney.org/professionals/ kdoqi/gfr%5Fcalculator          Passed - Patient is not pregnant      Passed - Valid encounter within last 12 months    Recent Outpatient Visits           2 weeks ago Left inguinal pain   [facility]   3 weeks ago Left lower quadrant abdominal pain   [facility]   1 month ago Left lower quadrant abdominal pain   [facility]   6 months ago Attention deficit   [facility]   1 year ago Attention deficit   [facility]       Future Appointments  In 2 days Zigmund Daniel, Earley Abide, MD [facility], [place]   In 4 months Reece Packer, Myna Hidalgo, [facility], [facility]

## 2021-12-06 ENCOUNTER — Encounter: Payer: Self-pay | Admitting: Family Medicine

## 2021-12-06 ENCOUNTER — Ambulatory Visit: Payer: BC Managed Care – PPO | Admitting: Family Medicine

## 2021-12-06 VITALS — BP 138/88 | HR 77 | Ht 73.0 in | Wt 228.0 lb

## 2021-12-06 DIAGNOSIS — R1032 Left lower quadrant pain: Secondary | ICD-10-CM | POA: Diagnosis not present

## 2021-12-06 DIAGNOSIS — M4727 Other spondylosis with radiculopathy, lumbosacral region: Secondary | ICD-10-CM | POA: Diagnosis not present

## 2021-12-06 MED ORDER — PREDNISONE 50 MG PO TABS: 50.0000 mg | ORAL_TABLET | Freq: Every day | ORAL | 0 refills | Status: DC

## 2021-12-06 MED ORDER — MELOXICAM 15 MG PO TABS: 15.0000 mg | ORAL_TABLET | Freq: Every day | ORAL | 0 refills | Status: DC

## 2021-12-06 NOTE — Progress Notes (Signed)
Primary Care / Sports Medicine Office Visit  Patient Information:  Patient ID: Ryan Roberts, male DOB: 1991/03/11 Age: 31 y.o. MRN: [id number]   Ryan Roberts is a pleasant 31 y.o. male presenting with the following:  Chief Complaint  Patient presents with   Left inguinal pain    States there are no changes    Vitals:   12/06/21 1012  BP: 138/88  Pulse: 77  SpO2: 98%   Vitals:   12/06/21 1012  Weight: 228 lb (103.4 kg)  Height: 6\' 1"  (1.854 m)   Body mass index is 30.08 kg/m.  LT LOWER EXTREM LTD SOFT TISSUE NON VASCULAR  Result Date: 11/30/2021 CLINICAL DATA:  Left lower quadrant abdominal and inguinal pain, evaluate for hernia EXAM: ULTRASOUND LEFT LOWER EXTREMITY LIMITED TECHNIQUE: Ultrasound examination of the lower extremity soft tissues was performed in the area of clinical concern. COMPARISON:  None Available. FINDINGS: Targeted ultrasound examination of the patient identified site of pain in the left groin reveals benign-appearing inguinal lymph nodes with normal cortical and hilar morphology measuring up to 3.0 x 1.2 x 0.8 cm. No evidence of mass, cyst, or hernia. IMPRESSION: No evidence of mass, cyst, or hernia at the patient identified site of pain. Normal, benign-appearing left inguinal lymph nodes. Consider CT or MRI to further evaluate otherwise unexplained pain. Electronically Signed   By: 01/30/2022 M.D.   On: 11/30/2021 10:02   01/30/2022 Abdomen Limited  Result Date: 11/28/2021 CLINICAL DATA:  Left lower quadrant pain for 6 weeks EXAM: ULTRASOUND ABDOMEN LIMITED COMPARISON:  None Available. FINDINGS: No abnormalities are identified in the region of the patient's symptoms. IMPRESSION: No sonographic abnormalities are identified in the region of the patient's symptoms. Electronically Signed   By: 11/30/2021 III M.D.   On: 11/28/2021 20:33   DG Lumbar Spine Complete  Result Date: 11/19/2021 CLINICAL DATA:  Acute on chronic pain. Evaluate L1-L2 levels in  particular. Upper lumbar area pain radiating into the left abdomen and groin. EXAM: LUMBAR SPINE - COMPLETE 4+ VIEW COMPARISON:  Lumbar radiograph 05/22/2018 FINDINGS: There are 5 non-rib-bearing lumbar vertebra. Right L1 transverse process is unfused, normal variant. Trace retrolisthesis of L5 on S1. Otherwise normal alignment. Minor disc space narrowing at L4-L5 and L5-S1. The L1-L2 disc spaces preserved. No significant facet arthropathy. Normal vertebral body heights. No evidence of fracture, pars defects or focal bone abnormality. Sacroiliac joints are congruent. IMPRESSION: 1. Mild degenerative disc disease at L4-L5 and L5-S1. 2. Unremarkable radiographic appearance of the L1-L2 level. Electronically Signed   By: 05/24/2018 M.D.   On: 11/19/2021 18:28     Independent interpretation of notes and tests performed by another provider:   Independent interpretation of lumbar spine x-rays reveals grade 1 retrolisthesis of L5 on S1, minimal retrolisthesis at L4-5, associated foraminal and intervertebral space narrowing, no acute osseous processes noted.  Procedures performed:   None  Pertinent History, Exam, Impression, and Recommendations:   Problem List Items Addressed This Visit       Nervous and Auditory   Lumbosacral spondylosis with radiculopathy - Primary    Patient with longstanding lumbosacral symptoms in the setting of prior spine injections at the L4-5 and L5-S1 levels, most recently during the 05/2021 timeframe.  His persistent left inguinal pain, given reassuring ultrasounds, can be considered most likely secondary to S2 nerve root involvement on the left, which can in turn be thought related to listhesis noted at the L5-S1 level.  I reviewed the  same with the patient as well as the x-ray images, he is amenable to course of prednisone followed by scheduled meloxicam, formal PT, and I encouraged him to coordinate a follow-up with his prior pain and spine group if injections are needed  in the setting of recalcitrant symptoms.      Relevant Medications   predniSONE (DELTASONE) 50 MG tablet   meloxicam (MOBIC) 15 MG tablet   Other Relevant Orders   Ambulatory referral to Physical Therapy     Other   Left inguinal pain    Patient presents for follow-up to left inguinal pain.  We reviewed both the left lower extremity/inguinal and left lower abdominal ultrasound studies which fortunately were both negative.  Since starting medications from last visit he has noted no worsening of pain with activities that he used to exacerbate symptoms (lifting, straining).  No new changes.  That being said, he has not noticed any significant improvement over interim visit.  Given the reassuring ultrasounds and in light of lumbar spine x-ray results, chronic history along the same lines, will proceed with continued and further focus on musculoskeletal treatment strategies. See additional assessment(s) for plan details.      Relevant Medications   meloxicam (MOBIC) 15 MG tablet   Other Relevant Orders   Ambulatory referral to Physical Therapy     Orders & Medications Meds ordered this encounter  Medications   predniSONE (DELTASONE) 50 MG tablet    Sig: Take 1 tablet (50 mg total) by mouth daily.    Dispense:  5 tablet    Refill:  0   meloxicam (MOBIC) 15 MG tablet    Sig: Take 1 tablet (15 mg total) by mouth daily.    Dispense:  30 tablet    Refill:  0   Orders Placed This Encounter  Procedures   Ambulatory referral to Physical Therapy     Return if symptoms worsen or fail to improve.     Jerrol Banana, MD   Primary Care Sports Medicine [facility]

## 2021-12-06 NOTE — Patient Instructions (Signed)
-   Start prednisone course - Start meloxicam after prednisone complete - Referral coordinator will contact you in regards to PT scheduling - Reach out to your prior back pain group who can provide injections and maintain follow-up with that group if symptoms fail to adequately respond to the above - Contact for questions and follow-up as needed

## 2021-12-06 NOTE — Assessment & Plan Note (Addendum)
Patient with longstanding lumbosacral symptoms in the setting of prior spine injections at the L4-5 and L5-S1 levels, most recently during the 05/2021 timeframe.  His persistent left inguinal pain, given reassuring ultrasounds, can be considered most likely secondary to S2 nerve root involvement on the left, which can in turn be thought related to listhesis noted at the L5-S1 level.  I reviewed the same with the patient as well as the x-ray images, he is amenable to course of prednisone followed by scheduled meloxicam, formal PT, and I encouraged him to coordinate a follow-up with his prior pain and spine group if injections are needed in the setting of recalcitrant symptoms.

## 2021-12-06 NOTE — Assessment & Plan Note (Signed)
Patient presents for follow-up to left inguinal pain.  We reviewed both the left lower extremity/inguinal and left lower abdominal ultrasound studies which fortunately were both negative.  Since starting medications from last visit he has noted no worsening of pain with activities that he used to exacerbate symptoms (lifting, straining).  No new changes.  That being said, he has not noticed any significant improvement over interim visit.  Given the reassuring ultrasounds and in light of lumbar spine x-ray results, chronic history along the same lines, will proceed with continued and further focus on musculoskeletal treatment strategies. See additional assessment(s) for plan details.

## 2021-12-31 ENCOUNTER — Other Ambulatory Visit: Payer: Self-pay | Admitting: Family Medicine

## 2021-12-31 DIAGNOSIS — R1032 Left lower quadrant pain: Secondary | ICD-10-CM

## 2022-01-02 NOTE — Telephone Encounter (Signed)
Requested medication (s) are due for refill today: yes  Requested medication (s) are on the active medication list: yes  Last refill:  12/06/21 #30 with 0 RF  Future visit scheduled: With Ms Reece Packer 04/19/22  Notes to clinic:  Dr Zigmund Daniel sent pt to ortho and she is in PT, unsure if this med was to continue, please assess.      Requested Prescriptions  Pending Prescriptions Disp Refills   meloxicam (MOBIC) 15 MG tablet [Pharmacy Med Name: MELOXICAM 15MG TABLETS] 30 tablet 0    Sig: TAKE 1 TABLET(15 MG) BY MOUTH DAILY     Analgesics:  COX2 Inhibitors Failed - 12/31/2021  7:52 AM      Failed - Manual Review: Labs are only required if the patient has taken medication for more than 8 weeks.      Passed - HGB in normal range and within 360 days    Hemoglobin  Date Value Ref Range Status  10/18/2021 16.9 13.2 - 17.1 g/dL Final         Passed - Cr in normal range and within 360 days    Creat  Date Value Ref Range Status  10/18/2021 0.97 0.60 - 1.26 mg/dL Final         Passed - HCT in normal range and within 360 days    HCT  Date Value Ref Range Status  10/18/2021 48.9 38.5 - 50.0 % Final         Passed - AST in normal range and within 360 days    AST  Date Value Ref Range Status  10/18/2021 20 10 - 40 U/L Final         Passed - ALT in normal range and within 360 days    ALT  Date Value Ref Range Status  10/18/2021 29 9 - 46 U/L Final         Passed - eGFR is 30 or above and within 360 days    GFR, Est African American  Date Value Ref Range Status  09/24/2019 114 > OR = 60 mL/min/1.30m Final   GFR, Est Non African American  Date Value Ref Range Status  09/24/2019 98 > OR = 60 mL/min/1.797mFinal   GFR, Estimated  Date Value Ref Range Status  09/21/2020 >60 >60 mL/min Final    Comment:    (NOTE) Calculated using the CKD-EPI Creatinine Equation (2021)    eGFR  Date Value Ref Range Status  10/18/2021 106 > OR = 60 mL/min/1.7315minal    Comment:    The eGFR  is based on the CKD-EPI 2021 equation. To calculate  the new eGFR from a previous Creatinine or Cystatin C result, go to https://www.kidney.org/professionals/ kdoqi/gfr%5Fcalculator          Passed - Patient is not pregnant      Passed - Valid encounter within last 12 months    Recent Outpatient Visits           3 weeks ago Lumbosacral spondylosis with radiculopathy   [facility]   1 month ago Left inguinal pain   [facility]   1 month ago Left lower quadrant abdominal pain   [facility]   2 months ago Left lower quadrant abdominal pain   [facility]   7 months ago Attention deficit   [facility]  Future Appointments             In 3 months Reece Packer, Myna Hidalgo, [facility], [facility]

## 2022-01-16 ENCOUNTER — Ambulatory Visit: Payer: BC Managed Care – PPO

## 2022-04-04 DIAGNOSIS — Z23 Encounter for immunization: Secondary | ICD-10-CM | POA: Diagnosis not present

## 2022-04-19 ENCOUNTER — Ambulatory Visit: Payer: BC Managed Care – PPO | Admitting: Nurse Practitioner

## 2022-04-19 ENCOUNTER — Other Ambulatory Visit: Payer: Self-pay

## 2022-04-19 ENCOUNTER — Encounter: Payer: Self-pay | Admitting: Nurse Practitioner

## 2022-04-19 VITALS — BP 124/82 | HR 98 | Temp 98.3°F | Resp 18 | Ht 73.0 in | Wt 226.5 lb

## 2022-04-19 DIAGNOSIS — R41840 Attention and concentration deficit: Secondary | ICD-10-CM

## 2022-04-19 DIAGNOSIS — L723 Sebaceous cyst: Secondary | ICD-10-CM

## 2022-04-19 DIAGNOSIS — E782 Mixed hyperlipidemia: Secondary | ICD-10-CM | POA: Diagnosis not present

## 2022-04-19 MED ORDER — ATOMOXETINE HCL 80 MG PO CAPS: 80.0000 mg | ORAL_CAPSULE | Freq: Every day | ORAL | 1 refills | Status: DC

## 2022-04-19 NOTE — Assessment & Plan Note (Signed)
She reports he is doing well on Strattera 80 mg daily.  Refill sent.

## 2022-04-19 NOTE — Assessment & Plan Note (Signed)
Patient has not had cholesterol checked in a couple years.  Patient would like to have it checked at next appointment.  Patient states he is going to work on eating healthier and exercising.

## 2022-04-19 NOTE — Progress Notes (Signed)
BP 124/82   Pulse 98   Temp 98.3 F (36.8 C) (Oral)   Resp 18   Ht 6\' 1"  (1.854 m)   Wt 226 lb 8 oz (102.7 kg)   SpO2 98%   BMI 29.88 kg/m    Subjective:    Patient ID: Ryan Roberts, male    DOB: [birth year], 31 y.o.   MRN: [id number]  HPI: Ryan Roberts is a 31 y.o. male  Chief Complaint  Patient presents with   ADHD    Medication refill   Cyst    Top of head for 1 year. It has grew bigger in size   ADD: Patient is currently taking Strattera 80 mg daily.  Patient reports he is doing really well on this dose.  He is able to focus and get things accomplished.  He denies any complications.  We will continue with current plan.  Refill sent.  Hyperlipidemia: His last LDL was 130 on 05/17/2020.  He is not currently on cholesterol-lowering medication.  We will get labs at next appointment.  Patient states he has not been eating the best.  And would like to work on that.  Cyst:  Patient reports he has had a cyst on the top of his head for over a year. He says that it has gotten bigger over the last two months. It is non painful. He would like to have it removed.  Will refer to surgery for removal.   Health maintenance: Due for labs, would like to check at next appointment  Relevant past medical, surgical, family and social history reviewed and updated as indicated. Interim medical history since our last visit reviewed. Allergies and medications reviewed and updated.  Review of Systems  Constitutional: Negative for fever or weight change.  Respiratory: Negative for cough and shortness of breath.   Cardiovascular: Negative for chest pain or palpitations.  Gastrointestinal: Negative for abdominal pain, no bowel changes.  Musculoskeletal: Negative for gait problem or joint swelling.  Skin: Negative for rash. Positive for cyst on top of head Neurological: Negative for dizziness or headache.  No other specific complaints in a complete review of systems (except as listed in HPI  above).      Objective:    BP 124/82   Pulse 98   Temp 98.3 F (36.8 C) (Oral)   Resp 18   Ht 6\' 1"  (1.854 m)   Wt 226 lb 8 oz (102.7 kg)   SpO2 98%   BMI 29.88 kg/m   Wt Readings from Last 3 Encounters:  04/19/22 226 lb 8 oz (102.7 kg)  12/06/21 228 lb (103.4 kg)  11/24/21 225 lb (102.1 kg)    Physical Exam  Constitutional: Patient appears well-developed and well-nourished.  No distress.  HEENT: head atraumatic, normocephalic, cyst onto of head, no redness or drainage, pupils equal and reactive to light, neck supple Cardiovascular: Normal rate, regular rhythm and normal heart sounds.  No murmur heard. No BLE edema. Pulmonary/Chest: Effort normal and breath sounds normal. No respiratory distress. Abdominal: Soft.  There is no tenderness. Psychiatric: Patient has a normal mood and affect. behavior is normal. Judgment and thought content normal.     Assessment & Plan:   Problem List Items Addressed This Visit       Other   Attention deficit - Primary    She reports he is doing well on Strattera 80 mg daily.  Refill sent.      Relevant Medications   atomoxetine (STRATTERA) 80 MG capsule  Mixed hyperlipidemia    Patient has not had cholesterol checked in a couple years.  Patient would like to have it checked at next appointment.  Patient states he is going to work on eating healthier and exercising.      Other Visit Diagnoses     Sebaceous cyst       Patient reports he has a cyst on the top of his head has been there for over a year.  Says over the last 2 months he feels like it is gotten bigger.     Relevant Orders   Ambulatory referral to General Surgery        Follow up plan: Return in about 6 months (around 10/19/2022) for follow up.

## 2022-04-29 ENCOUNTER — Encounter: Payer: Self-pay | Admitting: Surgery

## 2022-04-29 ENCOUNTER — Ambulatory Visit (INDEPENDENT_AMBULATORY_CARE_PROVIDER_SITE_OTHER): Payer: BC Managed Care – PPO | Admitting: Surgery

## 2022-04-29 VITALS — BP 130/90 | HR 91 | Temp 98.0°F | Ht 74.0 in | Wt 232.0 lb

## 2022-04-29 DIAGNOSIS — L728 Other follicular cysts of the skin and subcutaneous tissue: Secondary | ICD-10-CM | POA: Diagnosis not present

## 2022-04-29 DIAGNOSIS — L729 Follicular cyst of the skin and subcutaneous tissue, unspecified: Secondary | ICD-10-CM

## 2022-04-29 NOTE — Progress Notes (Signed)
04/29/2022  Reason for Visit:  Scalp cyst  Requesting Provider:  Della Goo, FNP  History of Present Illness: Ryan Roberts is a 31 y.o. male presenting for evaluation of a scalp cyst.  The patient reports having having multiple cysts, and has a scalp cyst that has been present for likely 2 years.  It was smaller in size before but he feels that it has grown more over the past 6 months.  It bothers him more when there's pressure on it such as wearing a hat.  Denies any quick worsening swelling, redness, worsening pain, or drainage from the area.  Denies any fevers/chills.  Past Medical History: Past Medical History:  Diagnosis Date   Lumbar herniated disc      Past Surgical History: Past Surgical History:  Procedure Laterality Date   TONSILLECTOMY      Home Medications: Prior to Admission medications   Medication Sig Start Date End Date Taking? Authorizing Provider  atomoxetine (STRATTERA) 80 MG capsule Take 1 capsule (80 mg total) by mouth daily. 04/19/22  Yes Berniece Salines, FNP    Allergies: Allergies  Allergen Reactions   No Known Allergies     Social History:  reports that he has never smoked. He has never been exposed to tobacco smoke. He has never used smokeless tobacco. He reports that he does not drink alcohol and does not use drugs.   Family History: Family History  Problem Relation Age of Onset   Diabetes Mother    Cancer Father    ADD / ADHD Father    Colon cancer Maternal Grandmother    Heart attack Maternal Grandfather    Diabetes Maternal Grandfather    Cancer Paternal Grandmother    Lung cancer Paternal Grandfather     Review of Systems: Review of Systems  Constitutional:  Negative for chills and fever.  Respiratory:  Negative for shortness of breath.   Cardiovascular:  Negative for chest pain.  Gastrointestinal:  Negative for abdominal pain, nausea and vomiting.  Skin:        Scalp cyst, increased soreness/size.    Physical Exam BP  (!) 130/90   Pulse 91   Temp 98 F (36.7 C)   Ht 6\' 2"  (1.88 m)   Wt 232 lb (105.2 kg)   SpO2 97%   BMI 29.79 kg/m  CONSTITUTIONAL: No acute distress HEENT:  The patient has an approximately 1.5 cm cyst at the top of the scalp, consistent with a sebaceous cyst.  It is soft, somewhat mobile, without any erythema or active drainage. RESPIRATORY:  Normal respiratory effort without pathologic use of accessory muscles. CARDIOVASCULAR: Regular rhythm and rate MUSCULOSKELETAL:  Normal muscle strength and tone in all four extremities.  No peripheral edema or cyanosis. NEUROLOGIC:  Motor and sensation is grossly normal.  Cranial nerves are grossly intact. PSYCH:  Alert and oriented to person, place and time. Affect is normal.  Laboratory Analysis: No results found for this or any previous visit (from the past 24 hour(s)).  Imaging: No results found.  Assessment and Plan: This is a 31 y.o. male with a scalp cyst  --Discussed with the patient that he indeed has a scalp cyst, likely a sebaceous cyst.  Reassured the patient that this is a benign cyst.  He has other areas of cysts that he deferred on me examining for now, as they are not bothering him.  He also has family history of cysts, and his mother has had some removed as well.  Discussed with  him that we can remove the cyst as an office procedure vs in the operating room.  He has opted for an office procedure. --Discussed with him the procedure at length, including the incision, risks of bleeding, infection, injury to surrounding structures, pain control, and he's willing to proceed. --Will schedule as an office procedure on 05/06/22.  I spent 30 minutes dedicated to the care of this patient on the date of this encounter to include pre-visit review of records, face-to-face time with the patient discussing diagnosis and management, and any post-visit coordination of care.   Melvyn Neth, [facility]

## 2022-04-29 NOTE — Patient Instructions (Signed)
We will schedule you for removal of your scalp cyst in the office. You may drive yourself.   Epidermoid Cyst  An epidermoid cyst, also called an epidermal cyst, is a small lump under your skin. The cyst contains a substance called keratin. Do not try to pop or open the cyst yourself. What are the causes? A blocked hair follicle. A hair that curls and re-enters the skin instead of growing straight out of the skin. A blocked pore. Irritated skin. An injury to the skin. Certain conditions that are passed along from parent to child. Human papillomavirus (HPV). This happens rarely when cysts occur on the bottom of the feet. Long-term sun damage to the skin. What increases the risk? Having acne. Being male. Having an injury to the skin. Being past puberty. Having certain conditions caused by genes (genetic disorder) What are the signs or symptoms? These cysts are usually harmless, but they can get infected. Symptoms of infection may include: Redness. Inflammation. Tenderness. Warmth. Fever. A bad-smelling substance that drains from the cyst. Pus that drains from the cyst. How is this treated? In many cases, epidermoid cysts go away on their own without treatment. If a cyst becomes infected, treatment may include: Opening and draining the cyst, done by a doctor. After draining, you may need minor surgery to remove the rest of the cyst. Antibiotic medicine. Shots of medicines (steroids) that help to reduce inflammation. Surgery to remove the cyst. Surgery may be done if the cyst: Becomes large. Bothers you. Has a chance of turning into cancer. Do not try to open a cyst yourself. Follow these instructions at home: Medicines Take over-the-counter and prescription medicines as told by your doctor. If you were prescribed an antibiotic medicine, take it as told by your doctor. Do not stop taking it even if you start to feel better. General instructions Keep the area around your cyst  clean and dry. Wear loose, dry clothing. Avoid touching your cyst. Check your cyst every day for signs of infection. Check for: Redness, swelling, or pain. Fluid or blood. Warmth. Pus or a bad smell. Keep all follow-up visits. How is this prevented? Wear clean, dry, clothing. Avoid wearing tight clothing. Keep your skin clean and dry. Take showers or baths every day. Contact a doctor if: Your cyst has symptoms of infection. Your condition does not improve or gets worse. You have a cyst that looks different from other cysts you have had. You have a fever. Get help right away if: Redness spreads from the cyst into the area close by. Summary An epidermoid cyst is a small lump under your skin. If a cyst becomes infected, treatment may include surgery to open and drain the cyst, or to remove it. Take over-the-counter and prescription medicines only as told by your doctor. Contact a doctor if your condition is not improving or is getting worse. Keep all follow-up visits. This information is not intended to replace advice given to you by your health care provider. Make sure you discuss any questions you have with your health care provider. Document Revised: 09/22/2019 Document Reviewed: 09/22/2019 Elsevier Patient Education  [employer].

## 2022-05-06 ENCOUNTER — Encounter: Payer: Self-pay | Admitting: Surgery

## 2022-05-06 ENCOUNTER — Other Ambulatory Visit: Payer: Self-pay

## 2022-05-06 ENCOUNTER — Ambulatory Visit: Payer: BC Managed Care – PPO | Admitting: Surgery

## 2022-05-06 VITALS — BP 140/89 | HR 101 | Temp 98.0°F | Ht 74.0 in | Wt 231.0 lb

## 2022-05-06 DIAGNOSIS — L723 Sebaceous cyst: Secondary | ICD-10-CM | POA: Diagnosis not present

## 2022-05-06 DIAGNOSIS — L729 Follicular cyst of the skin and subcutaneous tissue, unspecified: Secondary | ICD-10-CM

## 2022-05-06 NOTE — Progress Notes (Signed)
  Procedure Date:  05/06/2022  Pre-operative Diagnosis:  Scalp sebaceous cyst  Post-operative Diagnosis:  Scalp sebaceous cyst, 1 cm.  Procedure:  Excision of scalp sebaceous cyst, layered closure of incision 1.2 cm.  Surgeon:  Melvyn Neth, MD  Anesthesia:  2 ml 1% lidocaine with epi  Estimated Blood Loss:  30 ml  Specimens:  sebaceous cyst  Complications:  None  Indications for Procedure:  This is a 31 y.o. male with diagnosis of a symptomatic scalp cyst.  The patient wishes to have this excised. The risks of bleeding, abscess or infection, injury to surrounding structures, and need for further procedures were all discussed with the patient and he was willing to proceed.  Description of Procedure: The patient was correctly identified at bedside.  The patient was placed supine.  Appropriate time-outs were performed.  The patient's scalp was prepped and draped in usual sterile fashion.  Local anesthetic was infused intradermally.  An elliptical 1.2 cm incision was made over the cyst, and scalpel was used to dissect down the skin and subcutaneous tissue.  Skin flaps were created sharply, and then the cyst was excised intact.  There was arterial bleed from deep to the cyst area while it was being excised.  This required a 3-0 Vicryl suture and manual pressure for hemostasis.  Overall had about 30 ml blood loss.  The cyst was then sent off to pathology.  The cavity was then irrigated and hemostasis was good.  The wound was then closed in two layers using 3-0 Vicryl and 4-0 Monocryl.  The incision was cleaned and sealed with DermaBond.  The patient tolerated the procedure well and all sharps were appropriately disposed of at the end of the case.  --Patient's hair and scalp was cleaned after the procedure, but recommended that he can shower today when he gets home.  Let the water run down his scalp to help clean any residual blood. --May take Tylenol or Ibuprofen for pain control --Follow  up next week.  Melvyn Neth, MD

## 2022-05-06 NOTE — Patient Instructions (Signed)
We have removed a Cyst in our office today.  You have sutures under the skin that will dissolve and also dermabond (skin glue) on top of your skin which will come off on it's own in 10-14 days.  You may use Ibuprofen or Tylenol as needed for pain control. Use the ice pack 3-4 times a day for the next two days for any achiness.  You may shower tomorrow, do not scrub at the area.  Avoid Strenuous activities that will make you sweat during the next 48 hours to avoid the glue coming off prematurely. Avoid activities that will place pressure to this area of the body for 1-2 weeks to avoid re-injury to incision site.  Please see your follow-up appointment provided. We will see you back in office to make sure this area is healed and to review the final pathology. If you have any questions or concerns prior to this appointment, call our office and speak with a nurse.    Excision of Skin Cysts or Lesions Excision of a skin lesion refers to the removal of a section of skin by making small cuts (incisions) in the skin. This procedure may be done to remove a cancerous (malignant) or noncancerous (benign) growth on the skin. It is typically done to treat or prevent cancer or infection. It may also be done to improve cosmetic appearance. The procedure may be done to remove: Cancerous growths, such as basal cell carcinoma, squamous cell carcinoma, or melanoma. Noncancerous growths, such as a cyst or lipoma. Growths, such as moles or skin tags, which may be removed for cosmetic reasons.  Various excision or surgical techniques may be used depending on your condition, the location of the lesion, and your overall health. Tell a health care provider about: Any allergies you have. All medicines you are taking, including vitamins, herbs, eye drops, creams, and over-the-counter medicines. Any problems you or family members have had with anesthetic medicines. Any blood disorders you have. Any surgeries you have  had. Any medical conditions you have. Whether you are pregnant or may be pregnant. What are the risks? Generally, this is a safe procedure. However, problems may occur, including: Bleeding. Infection. Scarring. Recurrence of the cyst, lipoma, or cancer. Changes in skin sensation or appearance, such as discoloration or swelling. Reaction to the anesthetics. Allergic reaction to surgical materials or ointments. Damage to nerves, blood vessels, muscles, or other structures. Continued pain.  What happens before the procedure? Ask your health care provider about: Changing or stopping your regular medicines. This is especially important if you are taking diabetes medicines or blood thinners. Taking medicines such as aspirin and ibuprofen. These medicines can thin your blood. Do not take these medicines before your procedure if your health care provider instructs you not to. You may be asked to take certain medicines. You may be asked to stop smoking. You may have an exam or testing. What happens during the procedure? To reduce your risk of infection: Your health care team will wash or sanitize their hands. Your skin will be washed with soap. You will be given a medicine to numb the area (local anesthetic). One of the following excision techniques will be performed. At the end of any of these procedures, antibiotic ointment will be applied as needed. Each of the following techniques may vary among health care providers and hospitals. Complete Surgical Excision The area of skin that needs to be removed will be marked with a pen. Using a small scalpel or scissors, the surgeon   will gently cut around and under the lesion until it is completely removed. The lesion will be placed in a fluid and sent to the lab for examination. If necessary, bleeding will be controlled with a device that delivers heat (electrocautery). The edges of the wound may be stitched (sutured) together, and a bandage  (dressing) will be applied. This procedure may be performed to treat a cancerous growth or a noncancerous cyst or lesion. Excision of a Cyst The surgeon will make an incision on the cyst. The entire cyst will be removed through the incision. The incision may be closed with sutures.  What happens after the procedure? Return to your normal activities as told by your health care provider.  

## 2022-05-07 ENCOUNTER — Other Ambulatory Visit: Payer: Self-pay | Admitting: Surgery

## 2022-05-07 DIAGNOSIS — L7211 Pilar cyst: Secondary | ICD-10-CM | POA: Diagnosis not present

## 2022-05-16 ENCOUNTER — Encounter: Payer: Self-pay | Admitting: Physician Assistant

## 2022-05-16 ENCOUNTER — Ambulatory Visit (INDEPENDENT_AMBULATORY_CARE_PROVIDER_SITE_OTHER): Payer: BC Managed Care – PPO | Admitting: Physician Assistant

## 2022-05-16 VITALS — BP 142/92 | HR 76 | Temp 98.0°F | Wt 230.0 lb

## 2022-05-16 DIAGNOSIS — Z09 Encounter for follow-up examination after completed treatment for conditions other than malignant neoplasm: Secondary | ICD-10-CM

## 2022-05-16 DIAGNOSIS — L723 Sebaceous cyst: Secondary | ICD-10-CM

## 2022-05-16 DIAGNOSIS — L729 Follicular cyst of the skin and subcutaneous tissue, unspecified: Secondary | ICD-10-CM

## 2022-05-16 NOTE — Patient Instructions (Signed)
We have removed a Cyst in our office today.  You have sutures under the skin that will dissolve and also dermabond (skin glue) on top of your skin which will come off on it's own in 10-14 days.  You may use Ibuprofen or Tylenol as needed for pain control. Use the ice pack 3-4 times a day for the next two days for any achiness.  You may shower tomorrow, do not scrub at the area.  Avoid Strenuous activities that will make you sweat during the next 48 hours to avoid the glue coming off prematurely. Avoid activities that will place pressure to this area of the body for 1-2 weeks to avoid re-injury to incision site.  Please see your follow-up appointment provided. We will see you back in office to make sure this area is healed and to review the final pathology. If you have any questions or concerns prior to this appointment, call our office and speak with a nurse.    Excision of Skin Cysts or Lesions Excision of a skin lesion refers to the removal of a section of skin by making small cuts (incisions) in the skin. This procedure may be done to remove a cancerous (malignant) or noncancerous (benign) growth on the skin. It is typically done to treat or prevent cancer or infection. It may also be done to improve cosmetic appearance. The procedure may be done to remove: Cancerous growths, such as basal cell carcinoma, squamous cell carcinoma, or melanoma. Noncancerous growths, such as a cyst or lipoma. Growths, such as moles or skin tags, which may be removed for cosmetic reasons.  Various excision or surgical techniques may be used depending on your condition, the location of the lesion, and your overall health. Tell a health care provider about: Any allergies you have. All medicines you are taking, including vitamins, herbs, eye drops, creams, and over-the-counter medicines. Any problems you or family members have had with anesthetic medicines. Any blood disorders you have. Any surgeries you have  had. Any medical conditions you have. Whether you are pregnant or may be pregnant. What are the risks? Generally, this is a safe procedure. However, problems may occur, including: Bleeding. Infection. Scarring. Recurrence of the cyst, lipoma, or cancer. Changes in skin sensation or appearance, such as discoloration or swelling. Reaction to the anesthetics. Allergic reaction to surgical materials or ointments. Damage to nerves, blood vessels, muscles, or other structures. Continued pain.  What happens before the procedure? Ask your health care provider about: Changing or stopping your regular medicines. This is especially important if you are taking diabetes medicines or blood thinners. Taking medicines such as aspirin and ibuprofen. These medicines can thin your blood. Do not take these medicines before your procedure if your health care provider instructs you not to. You may be asked to take certain medicines. You may be asked to stop smoking. You may have an exam or testing. What happens during the procedure? To reduce your risk of infection: Your health care team will wash or sanitize their hands. Your skin will be washed with soap. You will be given a medicine to numb the area (local anesthetic). One of the following excision techniques will be performed. At the end of any of these procedures, antibiotic ointment will be applied as needed. Each of the following techniques may vary among health care providers and hospitals. Complete Surgical Excision The area of skin that needs to be removed will be marked with a pen. Using a small scalpel or scissors, the surgeon   will gently cut around and under the lesion until it is completely removed. The lesion will be placed in a fluid and sent to the lab for examination. If necessary, bleeding will be controlled with a device that delivers heat (electrocautery). The edges of the wound may be stitched (sutured) together, and a bandage  (dressing) will be applied. This procedure may be performed to treat a cancerous growth or a noncancerous cyst or lesion. Excision of a Cyst The surgeon will make an incision on the cyst. The entire cyst will be removed through the incision. The incision may be closed with sutures.  What happens after the procedure? Return to your normal activities as told by your health care provider.  

## 2022-05-16 NOTE — Progress Notes (Signed)
[facility] POST-OP OFFICE VISIT  05/16/2022  HPI: Ryan Roberts is a 31 y.o. male 10 days s/p excision of scalp sebaceous cyst with layered closure of incision (1.2 cm) with Dr Aleen Campi   Doing well; some residual soreness No fver, chills No drainage No other complaints   Vital signs: BP (!) 142/92   Pulse 76   Temp 98 F (36.7 C) (Oral)   Wt 230 lb (104.3 kg)   SpO2 97%   BMI 29.53 kg/m    Physical Exam: Constitutional: Well appearing male, NAD Skin: excision to mid scalp is well healed, no erythema, no drainage   Assessment/Plan: This is a 31 y.o. male 10 days s/p excision of scalp sebaceous cyst with layered closure of incision (1.2 cm) with Dr Aleen Campi    - Pain control prn  - Reviewed wound care recommendation  - Reviewed surgical pathology; Pilar Cyst   - He can follow up on as needed basis; He understands to call with questions/concerns  -- Ryan Oxford, PA-C [facility] 05/16/2022, 2:34 PM M-F: 7am - 4pm

## 2022-09-08 DIAGNOSIS — S01111A Laceration without foreign body of right eyelid and periocular area, initial encounter: Secondary | ICD-10-CM | POA: Diagnosis not present

## 2022-09-08 DIAGNOSIS — M778 Other enthesopathies, not elsewhere classified: Secondary | ICD-10-CM | POA: Diagnosis not present

## 2022-10-07 NOTE — Progress Notes (Unsigned)
   There were no vitals taken for this visit.   Subjective:    Patient ID: Ryan Roberts, male    DOB: March 23, 1991, 32 y.o.   MRN: [id number]  HPI: Ryan Roberts is a 32 y.o. male  No chief complaint on file.   Relevant past medical, surgical, family and social history reviewed and updated as indicated. Interim medical history since our last visit reviewed. Allergies and medications reviewed and updated.  Review of Systems  Constitutional: Negative for fever or weight change.  Respiratory: Negative for cough and shortness of breath.   Cardiovascular: Negative for chest pain or palpitations.  Gastrointestinal: Negative for abdominal pain, no bowel changes.  Musculoskeletal: Negative for gait problem or joint swelling.  Skin: Negative for rash.  Neurological: Negative for dizziness or headache.  No other specific complaints in a complete review of systems (except as listed in HPI above).      Objective:    There were no vitals taken for this visit.  Wt Readings from Last 3 Encounters:  05/16/22 230 lb (104.3 kg)  05/06/22 231 lb (104.8 kg)  04/29/22 232 lb (105.2 kg)    Physical Exam  Constitutional: Patient appears well-developed and well-nourished. Obese *** No distress.  HEENT: head atraumatic, normocephalic, pupils equal and reactive to light, ears ***, neck supple, throat within normal limits Cardiovascular: Normal rate, regular rhythm and normal heart sounds.  No murmur heard. No BLE edema. Pulmonary/Chest: Effort normal and breath sounds normal. No respiratory distress. Abdominal: Soft.  There is no tenderness. Psychiatric: Patient has a normal mood and affect. behavior is normal. Judgment and thought content normal.       Assessment & Plan:   Problem List Items Addressed This Visit   None    Follow up plan: No follow-ups on file.

## 2022-10-08 ENCOUNTER — Encounter: Payer: Self-pay | Admitting: Nurse Practitioner

## 2022-10-08 ENCOUNTER — Ambulatory Visit
Admission: RE | Admit: 2022-10-08 | Discharge: 2022-10-08 | Disposition: A | Discharge status: Home / Self Care | Payer: BC Managed Care – PPO | Attending: Nurse Practitioner | Admitting: Nurse Practitioner

## 2022-10-08 ENCOUNTER — Ambulatory Visit: Payer: BC Managed Care – PPO | Admitting: Nurse Practitioner

## 2022-10-08 ENCOUNTER — Ambulatory Visit
Admission: RE | Admit: 2022-10-08 | Discharge: 2022-10-08 | Disposition: A | Discharge status: Home / Self Care | Payer: BC Managed Care – PPO | Source: Ambulatory Visit | Attending: Nurse Practitioner | Admitting: Nurse Practitioner

## 2022-10-08 ENCOUNTER — Other Ambulatory Visit: Payer: Self-pay

## 2022-10-08 ENCOUNTER — Ambulatory Visit: Payer: BC Managed Care – PPO | Attending: Nurse Practitioner

## 2022-10-08 VITALS — BP 132/74 | HR 93 | Temp 98.0°F | Resp 16 | Ht 74.0 in | Wt 242.8 lb

## 2022-10-08 DIAGNOSIS — E782 Mixed hyperlipidemia: Secondary | ICD-10-CM

## 2022-10-08 DIAGNOSIS — R079 Chest pain, unspecified: Secondary | ICD-10-CM

## 2022-10-08 NOTE — Assessment & Plan Note (Signed)
Checking lipid panel. 

## 2022-10-09 LAB — TSH: TSH: 1.97 mIU/L (ref 0.40–4.50)

## 2022-10-09 LAB — CBC WITH DIFFERENTIAL/PLATELET
Absolute Monocytes: 499 cells/uL (ref 200–950)
Basophils Absolute: 29 cells/uL (ref 0–200)
Basophils Relative: 0.5 %
Eosinophils Absolute: 70 cells/uL (ref 15–500)
Eosinophils Relative: 1.2 %
HCT: 46.5 % (ref 38.5–50.0)
Hemoglobin: 15.9 g/dL (ref 13.2–17.1)
Lymphs Abs: 1844 cells/uL (ref 850–3900)
MCH: 29.6 pg (ref 27.0–33.0)
MCHC: 34.2 g/dL (ref 32.0–36.0)
MCV: 86.4 fL (ref 80.0–100.0)
MPV: 11.2 fL (ref 7.5–12.5)
Monocytes Relative: 8.6 %
Neutro Abs: 3358 cells/uL (ref 1500–7800)
Neutrophils Relative %: 57.9 %
Platelets: 262 10*3/uL (ref 140–400)
RBC: 5.38 10*6/uL (ref 4.20–5.80)
RDW: 12.5 % (ref 11.0–15.0)
Total Lymphocyte: 31.8 %
WBC: 5.8 10*3/uL (ref 3.8–10.8)

## 2022-10-09 LAB — LIPID PANEL
Cholesterol: 206 mg/dL — ABNORMAL HIGH (ref <200)
HDL: 43 mg/dL (ref >=40)
LDL Cholesterol (Calc): 127 mg/dL (calc) — ABNORMAL HIGH
Non-HDL Cholesterol (Calc): 163 mg/dL (calc) — ABNORMAL HIGH (ref <130)
Total CHOL/HDL Ratio: 4.8 (calc) (ref <5.0)
Triglycerides: 226 mg/dL — ABNORMAL HIGH (ref <150)

## 2022-10-09 LAB — COMPLETE METABOLIC PANEL WITH GFR
AG Ratio: 1.7 (calc) (ref 1.0–2.5)
ALT: 42 U/L (ref 9–46)
AST: 24 U/L (ref 10–40)
Albumin: 4.4 g/dL (ref 3.6–5.1)
Alkaline phosphatase (APISO): 66 U/L (ref 36–130)
BUN: 14 mg/dL (ref 7–25)
CO2: 24 mmol/L (ref 20–32)
Calcium: 9.4 mg/dL (ref 8.6–10.3)
Chloride: 104 mmol/L (ref 98–110)
Creat: 0.9 mg/dL (ref 0.60–1.26)
Globulin: 2.6 g/dL (calc) (ref 1.9–3.7)
Glucose, Bld: 84 mg/dL (ref 65–99)
Potassium: 4.2 mmol/L (ref 3.5–5.3)
Sodium: 137 mmol/L (ref 135–146)
Total Bilirubin: 0.6 mg/dL (ref 0.2–1.2)
Total Protein: 7 g/dL (ref 6.1–8.1)
eGFR: 116 mL/min/{1.73_m2} (ref >=60)

## 2022-10-12 DIAGNOSIS — R079 Chest pain, unspecified: Secondary | ICD-10-CM

## 2022-11-01 DIAGNOSIS — R079 Chest pain, unspecified: Secondary | ICD-10-CM | POA: Diagnosis not present

## 2022-11-07 ENCOUNTER — Other Ambulatory Visit: Payer: Self-pay | Admitting: Nurse Practitioner

## 2022-11-07 DIAGNOSIS — R079 Chest pain, unspecified: Secondary | ICD-10-CM

## 2023-01-01 ENCOUNTER — Encounter: Payer: Self-pay | Admitting: Cardiology

## 2023-01-01 ENCOUNTER — Ambulatory Visit: Payer: BC Managed Care – PPO | Attending: Cardiology | Admitting: Cardiology

## 2023-01-01 VITALS — BP 126/94 | HR 81 | Ht 74.0 in | Wt 239.4 lb

## 2023-01-01 DIAGNOSIS — R072 Precordial pain: Secondary | ICD-10-CM | POA: Diagnosis not present

## 2023-01-01 DIAGNOSIS — E782 Mixed hyperlipidemia: Secondary | ICD-10-CM | POA: Diagnosis not present

## 2023-01-01 NOTE — Progress Notes (Signed)
Cardiology Office Note:    Date:  01/01/2023   ID:  Ryan Roberts, DOB 1990-08-03, MRN [id number]  PCP:  Berniece Salines, FNP   [facility] Providers Cardiologist:  Debbe Odea, MD     Referring MD: Berniece Salines, FNP   Chief Complaint  Patient presents with   New Patient (Initial Visit)    New intermittent left side chest pains for past couple of months with no cardiac history.   Ryan Roberts is a 32 y.o. male who is being seen today for the evaluation of chest pain at the request of Berniece Salines, FNP.   History of Present Illness:    Ryan Roberts is a 32 y.o. male with a hx of ADHD, presenting with chest pain.   He states having left-sided chest discomfort ongoing over 4 months now.  Symptoms are not related with exertion occurring sporadically.  Overall, symptoms have improved over the last month or so.  He denies any history of heart disease, denies smoking.  Denies palpitations, dizziness, presyncope or syncope.  Past Medical History:  Diagnosis Date   Lumbar herniated disc    L4-L5    Past Surgical History:  Procedure Laterality Date   TONSILLECTOMY      Current Medications: Current Meds  Medication Sig   atomoxetine (STRATTERA) 80 MG capsule Take 1 capsule (80 mg total) by mouth daily.     Allergies:   No known allergies   Social History   Socioeconomic History   Marital status: Married    Spouse name: Not on file   Number of children: Not on file   Years of education: Not on file   Highest education level: Master's degree (e.g., MA, MS, MEng, MEd, MSW, MBA)  Occupational History   Not on file  Tobacco Use   Smoking status: Never    Passive exposure: Never   Smokeless tobacco: Never  Vaping Use   Vaping Use: Never used  Substance and Sexual Activity   Alcohol use: No   Drug use: No   Sexual activity: Not on file  Other Topics Concern   Not on file  Social History Narrative   Not on file   Social Determinants of  Health   Financial Resource Strain: Patient Declined (10/06/2022)   Overall Financial Resource Strain (CARDIA)    Difficulty of Paying Living Expenses: Patient declined  Food Insecurity: Patient Declined (10/06/2022)   Hunger Vital Sign    Worried About Running Out of Food in the Last Year: Patient declined    Ran Out of Food in the Last Year: Patient declined  Transportation Needs: Patient Declined (10/06/2022)   PRAPARE - Administrator, Civil Service (Medical): Patient declined    Lack of Transportation (Non-Medical): Patient declined  Physical Activity: Sufficiently Active (10/06/2022)   Exercise Vital Sign    Days of Exercise per Week: 5 days    Minutes of Exercise per Session: 40 min  Stress: No Stress Concern Present (10/06/2022)   [employer] of Occupational Health - Occupational Stress Questionnaire    Feeling of Stress : Not at all  Social Connections: Unknown (10/06/2022)   Social Connection and Isolation Panel [NHANES]    Frequency of Communication with Friends and Family: Patient declined    Frequency of Social Gatherings with Friends and Family: Patient declined    Attends Religious Services: Patient declined    Database administrator or Organizations: Patient declined    Attends Banker Meetings:  Not on file    Marital Status: Married     Family History: The patient's family history includes ADD / ADHD in his father; Cancer in his father and paternal grandmother; Colon cancer in his maternal grandmother; Diabetes in his maternal grandfather and mother; Heart attack in his maternal grandfather; Lung cancer in his paternal grandfather.  ROS:   Please see the history of present illness.     All other systems reviewed and are negative.  EKGs/Labs/Other Studies Reviewed:    The following studies were reviewed today:  EKG Interpretation Date/Time:  Wednesday January 01 2023 10:43:56 EDT Ventricular Rate:  81 PR Interval:  168 QRS Duration:  94 QT  Interval:  356 QTC Calculation: 413 R Axis:   26  Text Interpretation: Normal sinus rhythm Normal ECG Confirmed by Debbe Odea ([id number]) on 01/01/2023 11:00:28 AM    Recent Labs: 10/08/2022: ALT 42; BUN 14; Creat 0.90; Hemoglobin 15.9; Platelets 262; Potassium 4.2; Sodium 137; TSH 1.97  Recent Lipid Panel    Component Value Date/Time   CHOL 206 (H) 10/08/2022 1501   TRIG 226 (H) 10/08/2022 1501   HDL 43 10/08/2022 1501   CHOLHDL 4.8 10/08/2022 1501   LDLCALC 127 (H) 10/08/2022 1501     Risk Assessment/Calculations:     HYPERTENSION CONTROL Vitals:   01/01/23 1039 01/01/23 1045  BP: (!) 130/96 (!) 126/94    The patient's blood pressure is elevated above target today.  In order to address the patient's elevated BP: Blood pressure will be monitored at home to determine if medication changes need to be made.          Physical Exam:    VS:  BP (!) 126/94 (BP Location: Right Arm, Patient Position: Sitting, Cuff Size: Normal)   Pulse 81   Ht 6\' 2"  (1.88 m)   Wt 239 lb 6.4 oz (108.6 kg)   SpO2 98%   BMI 30.74 kg/m     Wt Readings from Last 3 Encounters:  01/01/23 239 lb 6.4 oz (108.6 kg)  10/08/22 242 lb 12.8 oz (110.1 kg)  05/16/22 230 lb (104.3 kg)     GEN:  Well nourished, well developed in no acute distress HEENT: Normal NECK: No JVD; No carotid bruits CARDIAC: RRR, no murmurs, rubs, gallops RESPIRATORY:  Clear to auscultation without rales, wheezing or rhonchi  ABDOMEN: Soft, non-tender, non-distended MUSCULOSKELETAL:  No edema; No deformity  SKIN: Warm and dry NEUROLOGIC:  Alert and oriented x 3 PSYCHIATRIC:  Normal affect   ASSESSMENT:    1. Precordial pain   2. Mixed hyperlipidemia    PLAN:    In order of problems listed above:  Chest pain, symptoms appear noncardiac.  Patient is low risk from a cardiac perspective.  Symptoms also improving over the past month or so.  Obtain echo to rule out any structural abnormalities.  If symptoms continue  to improve and echo otherwise normal, will monitor patient.  Consider additional testing if symptoms become worse or persist. Hyperlipidemia, not in statin benefit group. low-cholesterol diet, exercise.  Follow-up after echo in 2 to 3 months.     Medication Adjustments/Labs and Tests Ordered: Current medicines are reviewed at length with the patient today.  Concerns regarding medicines are outlined above.  Orders Placed This Encounter  Procedures   EKG 12-Lead   ECHOCARDIOGRAM COMPLETE   No orders of the defined types were placed in this encounter.   Patient Instructions  Medication Instructions:  Your physician recommends that you continue on  your current medications as directed. Please refer to the Current Medication list given to you today.  *If you need a refill on your cardiac medications before your next appointment, please call your pharmacy*  Lab Work: -None ordered  Testing/Procedures: Your physician has requested that you have an echocardiogram. Echocardiography is a painless test that uses sound waves to create images of your heart. It provides your doctor with information about the size and shape of your heart and how well your heart's chambers and valves are working. This procedure takes approximately one hour. There are no restrictions for this procedure. Please do NOT wear cologne, perfume, aftershave, or lotions (deodorant is allowed). Please arrive 15 minutes prior to your appointment time.    Follow-Up: At [facility], you and your health needs are our priority.  As part of our continuing mission to provide you with exceptional heart care, we have created designated Provider Care Teams.  These Care Teams include your primary Cardiologist (physician) and Advanced Practice Providers (APPs -  Physician Assistants and Nurse Practitioners) who all work together to provide you with the care you need, when you need it.  Your next appointment:   2 - 3   month(s)  Provider:   You may see Debbe Odea, MD or one of the following Advanced Practice Providers on your designated Care Team:   Nicolasa Ducking, NP Eula Listen, PA-C Cadence Fransico Michael, PA-C Charlsie Quest, NP    Other Instructions -None    Signed, Debbe Odea, MD  01/01/2023 11:29 AM    [facility]

## 2023-01-01 NOTE — Patient Instructions (Addendum)
Medication Instructions:  Your physician recommends that you continue on your current medications as directed. Please refer to the Current Medication list given to you today.  *If you need a refill on your cardiac medications before your next appointment, please call your pharmacy*  Lab Work: -None ordered  Testing/Procedures: Your physician has requested that you have an echocardiogram. Echocardiography is a painless test that uses sound waves to create images of your heart. It provides your doctor with information about the size and shape of your heart and how well your heart's chambers and valves are working. This procedure takes approximately one hour. There are no restrictions for this procedure. Please do NOT wear cologne, perfume, aftershave, or lotions (deodorant is allowed). Please arrive 15 minutes prior to your appointment time.    Follow-Up: At [facility], you and your health needs are our priority.  As part of our continuing mission to provide you with exceptional heart care, we have created designated Provider Care Teams.  These Care Teams include your primary Cardiologist (physician) and Advanced Practice Providers (APPs -  Physician Assistants and Nurse Practitioners) who all work together to provide you with the care you need, when you need it.  Your next appointment:   2 - 3  month(s)  Provider:   You may see Debbe Odea, MD or one of the following Advanced Practice Providers on your designated Care Team:   Nicolasa Ducking, NP Eula Listen, PA-C Cadence Fransico Michael, PA-C Charlsie Quest, NP    Other Instructions -None

## 2023-01-06 DIAGNOSIS — R059 Cough, unspecified: Secondary | ICD-10-CM | POA: Diagnosis not present

## 2023-01-06 DIAGNOSIS — U071 COVID-19: Secondary | ICD-10-CM | POA: Diagnosis not present

## 2023-01-06 DIAGNOSIS — J3489 Other specified disorders of nose and nasal sinuses: Secondary | ICD-10-CM | POA: Diagnosis not present

## 2023-01-22 ENCOUNTER — Other Ambulatory Visit: Payer: BC Managed Care – PPO

## 2023-03-09 ENCOUNTER — Other Ambulatory Visit: Payer: Self-pay | Admitting: Nurse Practitioner

## 2023-03-09 DIAGNOSIS — R41840 Attention and concentration deficit: Secondary | ICD-10-CM

## 2023-03-11 NOTE — Telephone Encounter (Signed)
Requested medication (s) are due for refill today: Yes  Requested medication (s) are on the active medication list: Yes  Last refill:  04/19/22  Future visit scheduled: No  Notes to clinic:  Not delegated.    Requested Prescriptions  Pending Prescriptions Disp Refills   atomoxetine (STRATTERA) 80 MG capsule [Pharmacy Med Name: ATOMOXETINE 80MG  CAPSULES] 90 capsule 1    Sig: TAKE 1 CAPSULE(80 MG) BY MOUTH DAILY     Not Delegated - Psychiatry: Norepinephrine Reuptake Inhibitor Failed - 03/09/2023 12:00 PM      Failed - This refill cannot be delegated      Failed - Last BP in normal range    BP Readings from Last 1 Encounters:  01/01/23 (!) 126/94         Passed - Completed PHQ-2 or PHQ-9 in the last 360 days      Passed - Last Heart Rate in normal range    Pulse Readings from Last 1 Encounters:  01/01/23 81         Passed - Valid encounter within last 6 months    Recent Outpatient Visits           5 months ago Chest pain, unspecified type   [facility] Berniece Salines, FNP   10 months ago Attention deficit   [facility] Berniece Salines, FNP   1 year ago Lumbosacral spondylosis with radiculopathy   [facility], Ocie Bob, MD   1 year ago Left inguinal pain   [facility]on, Ocie Bob, MD   1 year ago Left lower quadrant abdominal pain   [facility] Berniece Salines, [place]

## 2023-03-13 ENCOUNTER — Ambulatory Visit: Payer: BC Managed Care – PPO | Admitting: Cardiology

## 2023-07-21 ENCOUNTER — Encounter: Payer: Self-pay | Admitting: Nurse Practitioner

## 2023-07-21 ENCOUNTER — Ambulatory Visit: Payer: BC Managed Care – PPO | Admitting: Nurse Practitioner

## 2023-07-21 ENCOUNTER — Other Ambulatory Visit: Payer: Self-pay

## 2023-07-21 VITALS — BP 132/84 | HR 98 | Temp 98.1°F | Resp 18 | Ht 73.0 in | Wt 241.0 lb

## 2023-07-21 DIAGNOSIS — I10 Essential (primary) hypertension: Secondary | ICD-10-CM | POA: Diagnosis not present

## 2023-07-21 DIAGNOSIS — R051 Acute cough: Secondary | ICD-10-CM | POA: Diagnosis not present

## 2023-07-21 MED ORDER — BENZONATATE 100 MG PO CAPS
200.0000 mg | ORAL_CAPSULE | Freq: Two times a day (BID) | ORAL | 0 refills | Status: DC | PRN
Start: 2023-07-21 — End: 2023-08-25

## 2023-07-21 MED ORDER — AMLODIPINE BESYLATE 5 MG PO TABS
5.0000 mg | ORAL_TABLET | Freq: Every day | ORAL | 0 refills | Status: DC
Start: 2023-07-21 — End: 2023-08-25

## 2023-07-21 NOTE — Progress Notes (Signed)
BP 132/84 (Cuff Size: Large)   Pulse 98   Temp 98.1 F (36.7 C) (Oral)   Resp 18   Ht 6\' 1"  (1.854 m)   Wt 241 lb (109.3 kg)   SpO2 99%   BMI 31.80 kg/m    Subjective:    Patient ID: Ryan Roberts, male    DOB: [birth year], 33 y.o.   MRN: [id number]  HPI: Ryan Roberts is a 33 y.o. male  Chief Complaint  Patient presents with   Hypertension    BP has been elevated since starting Vyvanse    Discussed the use of AI scribe software for clinical note transcription with the patient, who gave verbal consent to proceed.  History of Present Illness   The patient, with a history of ADHD, presents with hypertension. They recently started Vyvanse for ADHD, which is known to raise blood pressure. Prior to starting Vyvanse, their blood pressure was in the 140s. Since starting Vyvanse, they have been monitoring their blood pressure at home and it has consistently been in the 150s over 105. They have been trying to lower their blood pressure with lifestyle changes, including exercising 3-5 times a week, increasing sleep and water intake, and reducing intake of fatty meats, sugar, and salt. However, due to their back pain, they are unable to exercise as much as they would like.  In addition, the patient has been coughing up phlegm for about three weeks, following a cold. They have also experienced some pain when breathing in on the right side of their chest, which has since resolved. They have not had a fever recently, but have noticed some difficulty getting a full breath.       10/08/2022    2:36 PM 04/19/2022    1:23 PM 12/06/2021   10:16 AM  Depression screen PHQ 2/9  Decreased Interest 0 0 0  Down, Depressed, Hopeless 0 0 0  PHQ - 2 Score 0 0 0  Altered sleeping   0  Tired, decreased energy   0  Change in appetite   0  Feeling bad or failure about yourself    0  Trouble concentrating   0  Moving slowly or fidgety/restless   0  Suicidal thoughts   0  PHQ-9 Score   0  Difficult  doing work/chores   Not difficult at all    Relevant past medical, surgical, family and social history reviewed and updated as indicated. Interim medical history since our last visit reviewed. Allergies and medications reviewed and updated.  Review of Systems  Constitutional: Negative for fever or weight change.  Respiratory: positive for cough and negative for shortness of breath.   Cardiovascular: Negative for chest pain or palpitations.  Gastrointestinal: Negative for abdominal pain, no bowel changes.  Musculoskeletal: Negative for gait problem or joint swelling.  Skin: Negative for rash.  Neurological: Negative for dizziness or headache.  No other specific complaints in a complete review of systems (except as listed in HPI above).      Objective:    BP 132/84 (Cuff Size: Large)   Pulse 98   Temp 98.1 F (36.7 C) (Oral)   Resp 18   Ht 6\' 1"  (1.854 m)   Wt 241 lb (109.3 kg)   SpO2 99%   BMI 31.80 kg/m   BP Readings from Last 3 Encounters:  07/21/23 132/84  01/01/23 (!) 126/94  10/08/22 132/74     Wt Readings from Last 3 Encounters:  07/21/23 241 lb (109.3 kg)  01/01/23  239 lb 6.4 oz (108.6 kg)  10/08/22 242 lb 12.8 oz (110.1 kg)    Physical Exam  Constitutional: Patient appears well-developed and well-nourished. Obese  No distress.  HEENT: head atraumatic, normocephalic, pupils equal and reactive to light, neck supple Cardiovascular: Normal rate, regular rhythm and normal heart sounds.  No murmur heard. No BLE edema. Pulmonary/Chest: Effort normal and breath sounds normal. No respiratory distress. Abdominal: Soft.  There is no tenderness. Psychiatric: Patient has a normal mood and affect. behavior is normal. Judgment and thought content normal.  Results for orders placed or performed in visit on 10/08/22  CBC with Differential/Platelet   Collection Time: 10/08/22  3:01 PM  Result Value Ref Range   WBC 5.8 3.8 - 10.8 Thousand/uL   RBC 5.38 4.20 - 5.80 Million/uL    Hemoglobin 15.9 13.2 - 17.1 g/dL   HCT 95.2 84.1 - 32.4 %   MCV 86.4 80.0 - 100.0 fL   MCH 29.6 27.0 - 33.0 pg   MCHC 34.2 32.0 - 36.0 g/dL   RDW 40.1 02.7 - 25.3 %   Platelets 262 140 - 400 Thousand/uL   MPV 11.2 7.5 - 12.5 fL   Neutro Abs 3,358 1,500 - 7,800 cells/uL   Lymphs Abs 1,844 850 - 3,900 cells/uL   Absolute Monocytes 499 200 - 950 cells/uL   Eosinophils Absolute 70 15 - 500 cells/uL   Basophils Absolute 29 0 - 200 cells/uL   Neutrophils Relative % 57.9 %   Total Lymphocyte 31.8 %   Monocytes Relative 8.6 %   Eosinophils Relative 1.2 %   Basophils Relative 0.5 %  COMPLETE METABOLIC PANEL WITH GFR   Collection Time: 10/08/22  3:01 PM  Result Value Ref Range   Glucose, Bld 84 65 - 99 mg/dL   BUN 14 7 - 25 mg/dL   Creat 6.64 4.03 - 4.74 mg/dL   eGFR 259 > OR = 60 DG/LOV/5.64P3   BUN/Creatinine Ratio SEE NOTE: 6 - 22 (calc)   Sodium 137 135 - 146 mmol/L   Potassium 4.2 3.5 - 5.3 mmol/L   Chloride 104 98 - 110 mmol/L   CO2 24 20 - 32 mmol/L   Calcium 9.4 8.6 - 10.3 mg/dL   Total Protein 7.0 6.1 - 8.1 g/dL   Albumin 4.4 3.6 - 5.1 g/dL   Globulin 2.6 1.9 - 3.7 g/dL (calc)   AG Ratio 1.7 1.0 - 2.5 (calc)   Total Bilirubin 0.6 0.2 - 1.2 mg/dL   Alkaline phosphatase (APISO) 66 36 - 130 U/L   AST 24 10 - 40 U/L   ALT 42 9 - 46 U/L  Lipid panel   Collection Time: 10/08/22  3:01 PM  Result Value Ref Range   Cholesterol 206 (H) <200 mg/dL   HDL 43 > OR = 40 mg/dL   Triglycerides 295 (H) <150 mg/dL   LDL Cholesterol (Calc) 127 (H) mg/dL (calc)   Total CHOL/HDL Ratio 4.8 <5.0 (calc)   Non-HDL Cholesterol (Calc) 163 (H) <130 mg/dL (calc)  TSH   Collection Time: 10/08/22  3:01 PM  Result Value Ref Range   TSH 1.97 0.40 - 4.50 mIU/L   Last CBC Lab Results  Component Value Date   WBC 5.8 10/08/2022   HGB 15.9 10/08/2022   HCT 46.5 10/08/2022   MCV 86.4 10/08/2022   MCH 29.6 10/08/2022   RDW 12.5 10/08/2022   PLT 262 10/08/2022   Last metabolic panel Lab  Results  Component Value Date   GLUCOSE 84  10/08/2022   NA 137 10/08/2022   K 4.2 10/08/2022   CL 104 10/08/2022   CO2 24 10/08/2022   BUN 14 10/08/2022   CREATININE 0.90 10/08/2022   EGFR 116 10/08/2022   CALCIUM 9.4 10/08/2022   PROT 7.0 10/08/2022   ALBUMIN 4.8 09/21/2020   BILITOT 0.6 10/08/2022   ALKPHOS 57 09/21/2020   AST 24 10/08/2022   ALT 42 10/08/2022   ANIONGAP 8 09/21/2020        Assessment & Plan:   Problem List Items Addressed This Visit       Cardiovascular and Mediastinum   Primary hypertension - Primary   Relevant Medications   amLODipine (NORVASC) 5 MG tablet   Other Visit Diagnoses       Acute cough       Relevant Medications   benzonatate (TESSALON) 100 MG capsule   Other Relevant Orders   DG Chest 2 View        Assessment and Plan    Hypertension Newly diagnosed, likely exacerbated by Vyvanse for ADHD. Patient has been making lifestyle modifications but has limitations due to back pain. -Start Amlodipine 5mg  daily. -Check blood pressure in 4 weeks.  ADHD Recently started on Vyvanse, which may be contributing to hypertension. -Continue Vyvanse at current dose. -Discuss potential medication adjustments with psychiatrist if blood pressure remains elevated despite antihypertensive therapy.  Respiratory symptoms Persistent cough and phlegm production for 3 weeks following a cold, with occasional difficulty taking a full breath. No fever or wheezing. -Order chest X-ray to rule out pneumonia. -Prescribe cough medicine as needed. -Advise patient to take Mucinex and increase water intake to help with mucus clearance. -If chest X-ray shows pneumonia, prescribe appropriate antibiotic.        Follow up plan: Return in about 4 weeks (around 08/18/2023) for follow up.

## 2023-07-22 ENCOUNTER — Ambulatory Visit
Admission: RE | Admit: 2023-07-22 | Discharge: 2023-07-22 | Disposition: A | Discharge status: Home / Self Care | Payer: BC Managed Care – PPO | Source: Ambulatory Visit | Attending: Nurse Practitioner | Admitting: Nurse Practitioner

## 2023-07-22 ENCOUNTER — Ambulatory Visit
Admission: RE | Admit: 2023-07-22 | Discharge: 2023-07-22 | Disposition: A | Discharge status: Home / Self Care | Payer: BC Managed Care – PPO | Attending: Nurse Practitioner | Admitting: Nurse Practitioner

## 2023-07-22 DIAGNOSIS — R051 Acute cough: Secondary | ICD-10-CM | POA: Insufficient documentation

## 2023-07-22 DIAGNOSIS — R059 Cough, unspecified: Secondary | ICD-10-CM | POA: Diagnosis not present

## 2023-07-25 ENCOUNTER — Encounter: Payer: Self-pay | Admitting: Nurse Practitioner

## 2023-08-25 ENCOUNTER — Ambulatory Visit: Payer: BC Managed Care – PPO | Admitting: Nurse Practitioner

## 2023-08-25 ENCOUNTER — Encounter: Payer: Self-pay | Admitting: Nurse Practitioner

## 2023-08-25 DIAGNOSIS — R41840 Attention and concentration deficit: Secondary | ICD-10-CM | POA: Diagnosis not present

## 2023-08-25 DIAGNOSIS — I10 Essential (primary) hypertension: Secondary | ICD-10-CM

## 2023-08-25 MED ORDER — AMLODIPINE BESYLATE 5 MG PO TABS
5.0000 mg | ORAL_TABLET | Freq: Every day | ORAL | 1 refills | Status: DC
Start: 2023-08-25 — End: 2024-02-29

## 2023-08-25 NOTE — Progress Notes (Signed)
 BP 120/74   Pulse 98   Temp 98.7 F (37.1 C)   Resp 18   Ht 6\' 1"  (1.854 m)   Wt 237 lb (107.5 kg)   SpO2 98%   BMI 31.27 kg/m    Subjective:    Patient ID: Ryan Roberts, male    DOB: Sep 03, 1990, 33 y.o.   MRN: [id number]  HPI: [name] is a 33 y.o. male  Chief Complaint  Patient presents with   Medical Management of Chronic Issues    Discussed the use of AI scribe software for clinical note transcription with the patient, who gave verbal consent to proceed.  History of Present Illness   The patient, with a history of ADHD and hypertension, presents for follow-up. He reports that his blood pressure has been consistently high, in the 150s over 105, since starting Vyvanse for ADHD. Despite lifestyle modifications, including regular exercise, increased sleep and water intake, and a diet low in fatty meats, sugar, and salt, his blood pressure remains elevated. He was started on amlodipine 5mg  daily for hypertension.  The patient also discusses ongoing adjustments to his ADHD treatment. He is currently on Vyvanse and Strattera. He has not noticed any changes since starting the amlodipine.       10/08/2022    2:36 PM 04/19/2022    1:23 PM 12/06/2021   10:16 AM  Depression screen PHQ 2/9  Decreased Interest 0 0 0  Down, Depressed, Hopeless 0 0 0  PHQ - 2 Score 0 0 0  Altered sleeping   0  Tired, decreased energy   0  Change in appetite   0  Feeling bad or failure about yourself    0  Trouble concentrating   0  Moving slowly or fidgety/restless   0  Suicidal thoughts   0  PHQ-9 Score   0  Difficult doing work/chores   Not difficult at all    Relevant past medical, surgical, family and social history reviewed and updated as indicated. Interim medical history since our last visit reviewed. Allergies and medications reviewed and updated.  Review of Systems  Constitutional: Negative for fever or weight change.  Respiratory: Negative for cough and shortness of  breath.   Cardiovascular: Negative for chest pain or palpitations.  Gastrointestinal: Negative for abdominal pain, no bowel changes.  Musculoskeletal: Negative for gait problem or joint swelling.  Skin: Negative for rash.  Neurological: Negative for dizziness or headache.  No other specific complaints in a complete review of systems (except as listed in HPI above).      Objective:    BP 120/74   Pulse 98   Temp 98.7 F (37.1 C)   Resp 18   Ht 6\' 1"  (1.854 m)   Wt 237 lb (107.5 kg)   SpO2 98%   BMI 31.27 kg/m   BP Readings from Last 3 Encounters:  08/25/23 120/74  07/21/23 132/84  01/01/23 (!) 126/94     Wt Readings from Last 3 Encounters:  08/25/23 237 lb (107.5 kg)  07/21/23 241 lb (109.3 kg)  01/01/23 239 lb 6.4 oz (108.6 kg)    Physical Exam Vitals reviewed.  Constitutional:      Appearance: Normal appearance.  HENT:     Head: Normocephalic.  Cardiovascular:     Rate and Rhythm: Normal rate and regular rhythm.  Pulmonary:     Effort: Pulmonary effort is normal.     Breath sounds: Normal breath sounds.  Musculoskeletal:  General: Normal range of motion.  Skin:    General: Skin is warm and dry.  Neurological:     General: No focal deficit present.     Mental Status: He is alert and oriented to person, place, and time. Mental status is at baseline.  Psychiatric:        Mood and Affect: Mood normal.        Behavior: Behavior normal.        Thought Content: Thought content normal.        Judgment: Judgment normal.     Results for orders placed or performed in visit on 10/08/22  CBC with Differential/Platelet   Collection Time: 10/08/22  3:01 PM  Result Value Ref Range   WBC 5.8 3.8 - 10.8 Thousand/uL   RBC 5.38 4.20 - 5.80 Million/uL   Hemoglobin 15.9 13.2 - 17.1 g/dL   HCT 16.1 09.6 - 04.5 %   MCV 86.4 80.0 - 100.0 fL   MCH 29.6 27.0 - 33.0 pg   MCHC 34.2 32.0 - 36.0 g/dL   RDW 40.9 81.1 - 91.4 %   Platelets 262 140 - 400 Thousand/uL   MPV  11.2 7.5 - 12.5 fL   Neutro Abs 3,358 1,500 - 7,800 cells/uL   Lymphs Abs 1,844 850 - 3,900 cells/uL   Absolute Monocytes 499 200 - 950 cells/uL   Eosinophils Absolute 70 15 - 500 cells/uL   Basophils Absolute 29 0 - 200 cells/uL   Neutrophils Relative % 57.9 %   Total Lymphocyte 31.8 %   Monocytes Relative 8.6 %   Eosinophils Relative 1.2 %   Basophils Relative 0.5 %  COMPLETE METABOLIC PANEL WITH GFR   Collection Time: 10/08/22  3:01 PM  Result Value Ref Range   Glucose, Bld 84 65 - 99 mg/dL   BUN 14 7 - 25 mg/dL   Creat 7.82 9.56 - 2.13 mg/dL   eGFR 086 > OR = 60 VH/QIO/9.62X5   BUN/Creatinine Ratio SEE NOTE: 6 - 22 (calc)   Sodium 137 135 - 146 mmol/L   Potassium 4.2 3.5 - 5.3 mmol/L   Chloride 104 98 - 110 mmol/L   CO2 24 20 - 32 mmol/L   Calcium 9.4 8.6 - 10.3 mg/dL   Total Protein 7.0 6.1 - 8.1 g/dL   Albumin 4.4 3.6 - 5.1 g/dL   Globulin 2.6 1.9 - 3.7 g/dL (calc)   AG Ratio 1.7 1.0 - 2.5 (calc)   Total Bilirubin 0.6 0.2 - 1.2 mg/dL   Alkaline phosphatase (APISO) 66 36 - 130 U/L   AST 24 10 - 40 U/L   ALT 42 9 - 46 U/L  Lipid panel   Collection Time: 10/08/22  3:01 PM  Result Value Ref Range   Cholesterol 206 (H) <200 mg/dL   HDL 43 > OR = 40 mg/dL   Triglycerides 284 (H) <150 mg/dL   LDL Cholesterol (Calc) 127 (H) mg/dL (calc)   Total CHOL/HDL Ratio 4.8 <5.0 (calc)   Non-HDL Cholesterol (Calc) 163 (H) <130 mg/dL (calc)  TSH   Collection Time: 10/08/22  3:01 PM  Result Value Ref Range   TSH 1.97 0.40 - 4.50 mIU/L   Last CBC Lab Results  Component Value Date   WBC 5.8 10/08/2022   HGB 15.9 10/08/2022   HCT 46.5 10/08/2022   MCV 86.4 10/08/2022   MCH 29.6 10/08/2022   RDW 12.5 10/08/2022   PLT 262 10/08/2022   Last metabolic panel Lab Results  Component Value Date  GLUCOSE 84 10/08/2022   NA 137 10/08/2022   K 4.2 10/08/2022   CL 104 10/08/2022   CO2 24 10/08/2022   BUN 14 10/08/2022   CREATININE 0.90 10/08/2022   EGFR 116 10/08/2022   CALCIUM  9.4 10/08/2022   PROT 7.0 10/08/2022   ALBUMIN 4.8 09/21/2020   BILITOT 0.6 10/08/2022   ALKPHOS 57 09/21/2020   AST 24 10/08/2022   ALT 42 10/08/2022   ANIONGAP 8 09/21/2020   Last lipids Lab Results  Component Value Date   CHOL 206 (H) 10/08/2022   HDL 43 10/08/2022   LDLCALC 127 (H) 10/08/2022   TRIG 226 (H) 10/08/2022   CHOLHDL 4.8 10/08/2022   Last hemoglobin A1c No results found for: "HGBA1C"      Assessment & Plan:   Problem List Items Addressed This Visit       Cardiovascular and Mediastinum   Primary hypertension   Relevant Medications   amLODipine (NORVASC) 5 MG tablet     Assessment and Plan    Hypertension Elevated blood pressure readings at home, but normal in office today. Patient is on Amlodipine 5mg  daily and tolerating it well. No side effects reported. Patient is also making lifestyle modifications. -Continue Amlodipine 5mg  daily. -Check home blood pressure monitor for accuracy by comparing readings with a public machine (e.g., at a pharmacy). -Continue lifestyle modifications. -Check blood pressure at home and report if consistently elevated.  ADHD Currently on Vyvanse, but may change due to ongoing treatment adjustments. -Continue current ADHD management as directed by specialist.  Follow-up in 6 months or sooner if blood pressure remains elevated at home.        Follow up plan: Return in about 6 months (around 02/22/2024) for follow up.

## 2023-11-18 IMAGING — CR DG LUMBAR SPINE COMPLETE 4+V
5 series · 5 of 5 positions shown · non-contrast
Comparison: Lumbar radiograph 05/22/2018

CLINICAL DATA: Acute on chronic pain. Evaluate L1-L2 levels in
particular. Upper lumbar area pain radiating into the left abdomen
and groin.

EXAM:
LUMBAR SPINE - COMPLETE 4+ VIEW

[l-spine ap]
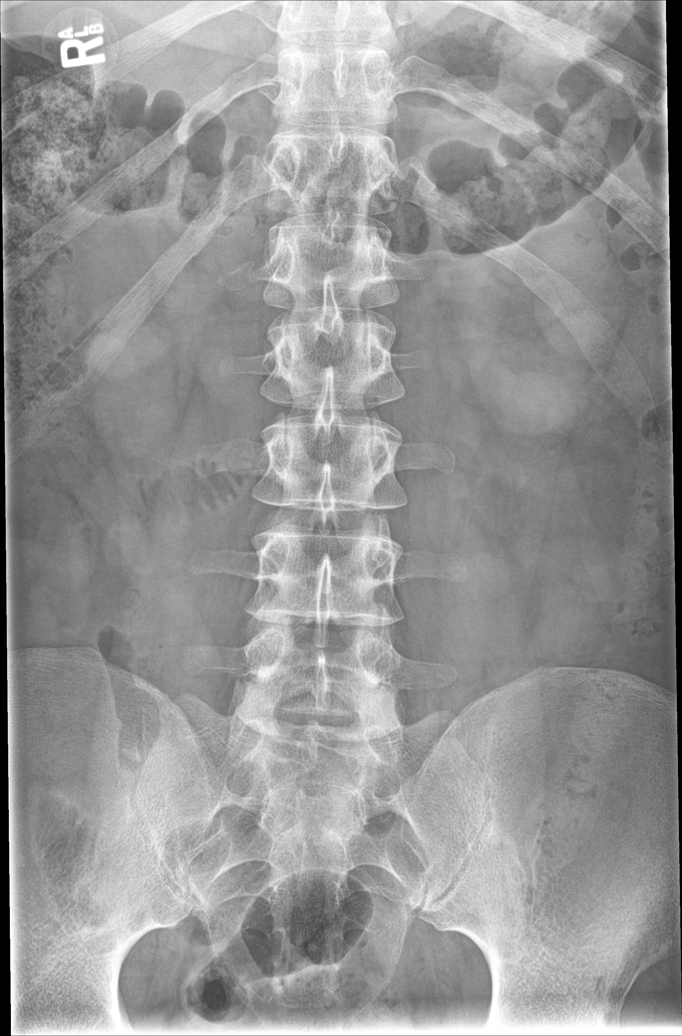

[l-spine obl (1 of 2)]
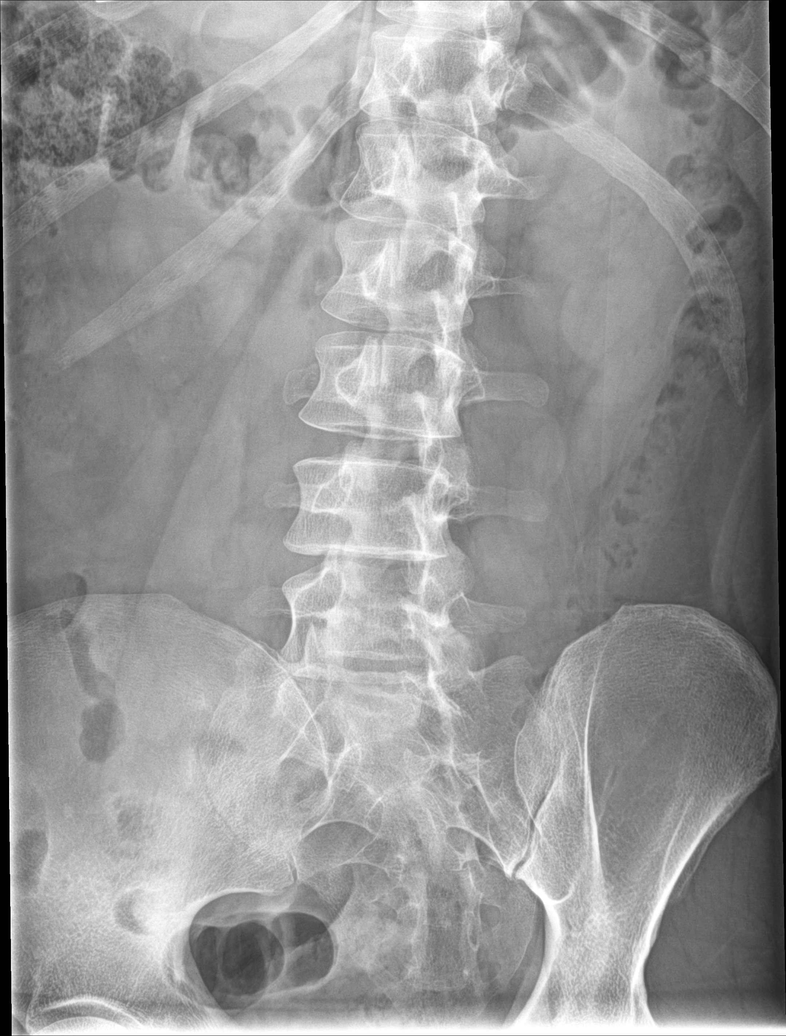

[l-spine obl (2 of 2)]
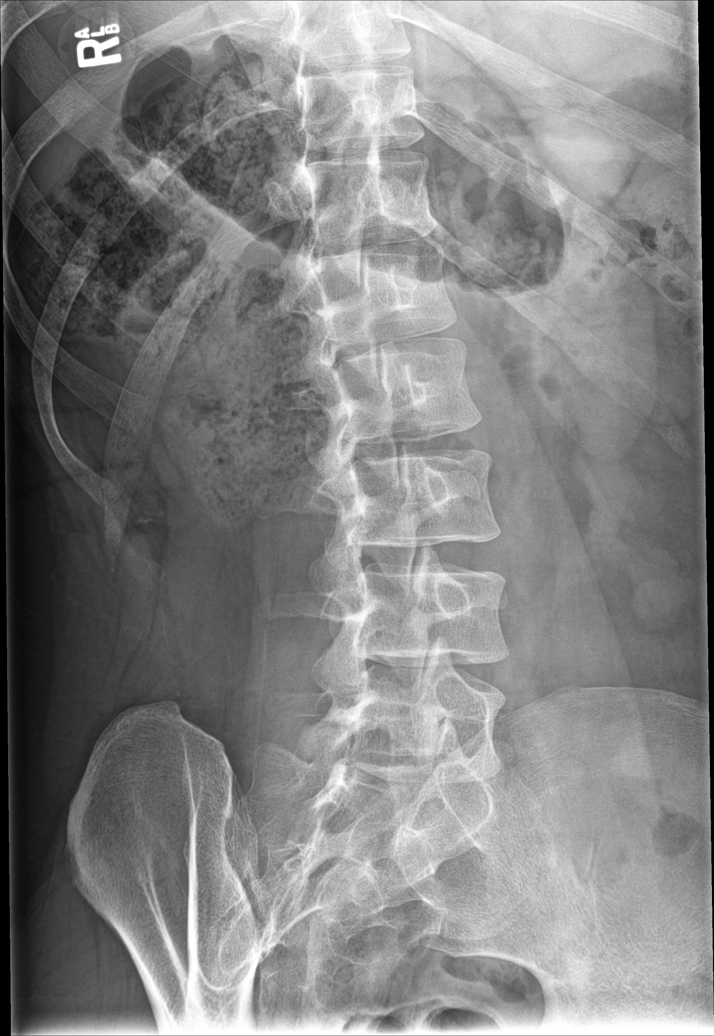

[l-spine lat]
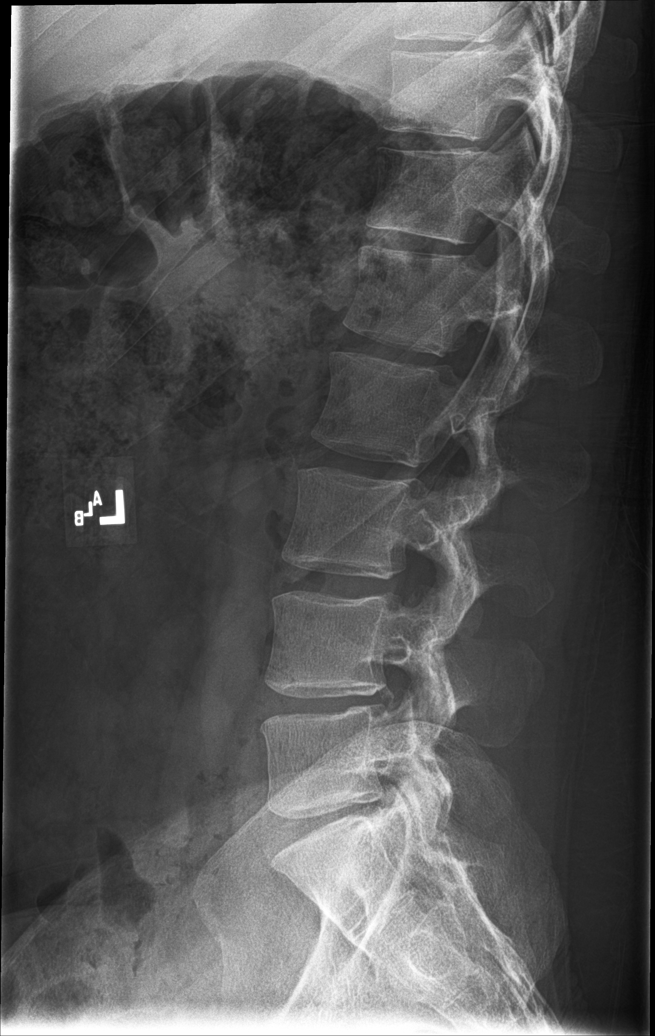

[l-spine spot]
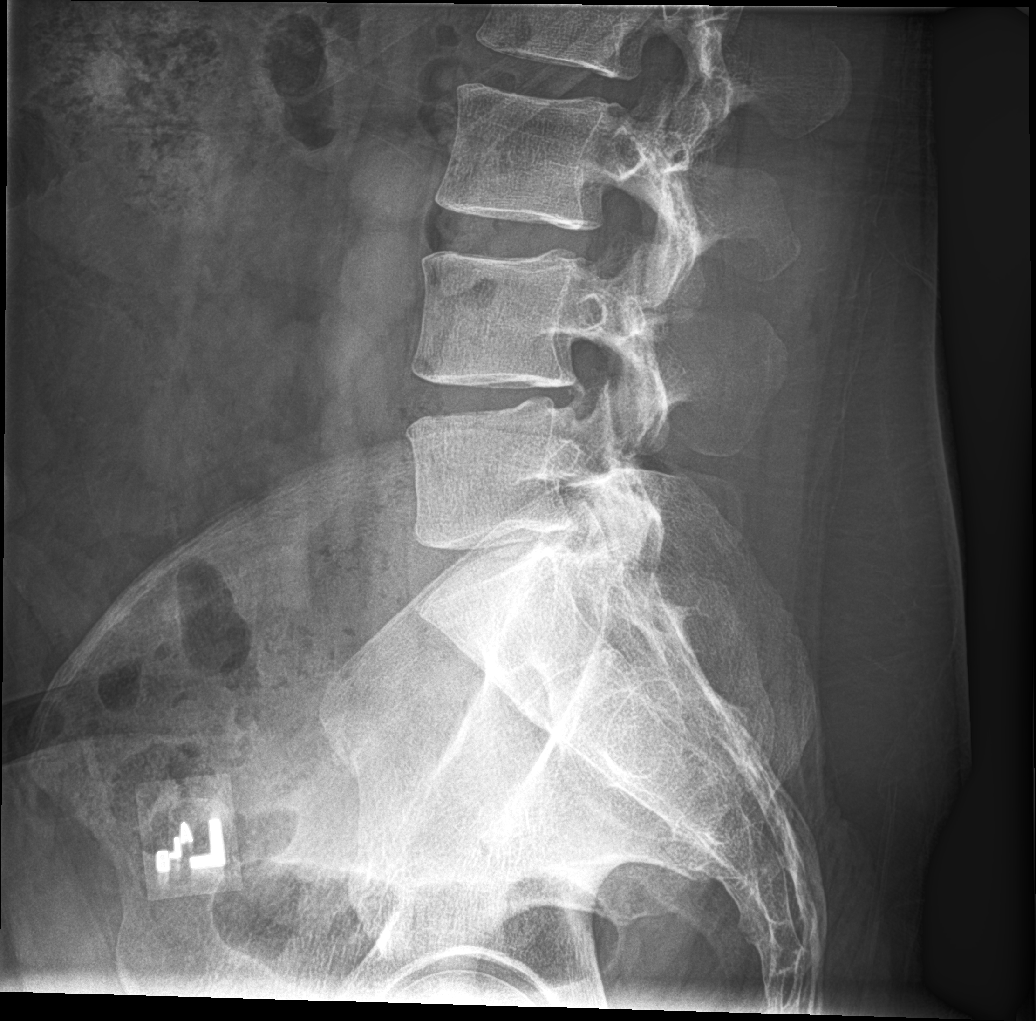

[5 of 5 positions shown; findings below may reference images not displayed]

FINDINGS: There are 5 non-rib-bearing lumbar vertebra. Right L1 transverse
process is unfused, normal variant. Trace retrolisthesis of L5 on
S1. Otherwise normal alignment. Minor disc space narrowing at L4-L5
and L5-S1. The L1-L2 disc spaces preserved. No significant facet
arthropathy. Normal vertebral body heights. No evidence of fracture,
pars defects or focal bone abnormality. Sacroiliac joints are
congruent.
IMPRESSION: 1. Mild degenerative disc disease at L4-L5 and L5-S1.
2. Unremarkable radiographic appearance of the L1-L2 level.

## 2023-12-15 DIAGNOSIS — M5416 Radiculopathy, lumbar region: Secondary | ICD-10-CM | POA: Diagnosis not present

## 2024-01-07 DIAGNOSIS — M5416 Radiculopathy, lumbar region: Secondary | ICD-10-CM | POA: Diagnosis not present

## 2024-01-23 DIAGNOSIS — M5416 Radiculopathy, lumbar region: Secondary | ICD-10-CM | POA: Diagnosis not present

## 2024-02-23 ENCOUNTER — Ambulatory Visit: Payer: BC Managed Care – PPO | Admitting: Nurse Practitioner

## 2024-02-27 ENCOUNTER — Other Ambulatory Visit: Payer: Self-pay | Admitting: Nurse Practitioner

## 2024-02-27 DIAGNOSIS — I10 Essential (primary) hypertension: Secondary | ICD-10-CM

## 2024-02-29 NOTE — Telephone Encounter (Signed)
 Requested Prescriptions  Pending Prescriptions Disp Refills   amLODipine  (NORVASC ) 5 MG tablet [Pharmacy Med Name: AMLODIPINE  BESYLATE 5MG  TABLETS] 90 tablet 0    Sig: TAKE 1 TABLET(5 MG) BY MOUTH DAILY     Cardiovascular: Calcium Channel Blockers 2 Failed - 02/29/2024 10:42 PM      Failed - Valid encounter within last 6 months    Recent Outpatient Visits           6 months ago Primary hypertension   [facility] Gareth Mliss [place], FNP       Future Appointments             In 4 days Gareth Mliss FALCON, FNP [facility] [place]            Passed - Last BP in normal range    BP Readings from Last 1 Encounters:  08/25/23 120/74         Passed - Last Heart Rate in normal range    Pulse Readings from Last 1 Encounters:  08/25/23 98

## 2024-03-04 ENCOUNTER — Ambulatory Visit: Admitting: Nurse Practitioner

## 2024-05-05 ENCOUNTER — Other Ambulatory Visit: Payer: Self-pay | Admitting: Nurse Practitioner

## 2024-05-05 DIAGNOSIS — I10 Essential (primary) hypertension: Secondary | ICD-10-CM

## 2024-05-06 NOTE — Telephone Encounter (Signed)
 Courtesy refill. Patient will need an office visit for additional refills.  Requested Prescriptions  Pending Prescriptions Disp Refills   amLODipine  (NORVASC ) 5 MG tablet [Pharmacy Med Name: AMLODIPINE  BESYLATE 5 MG TAB] 90 tablet 0    Sig: TAKE 1 TABLET BY MOUTH DAILY     Cardiovascular: Calcium Channel Blockers 2 Failed - 05/06/2024  5:59 PM      Failed - Valid encounter within last 6 months    Recent Outpatient Visits           8 months ago Primary hypertension   [facility] Gareth Mliss FAL[place], FNP              Passed - Last BP in normal range    BP Readings from Last 1 Encounters:  08/25/23 120/74         Passed - Last Heart Rate in normal range    Pulse Readings from Last 1 Encounters:  08/25/23 98

## 2024-05-12 ENCOUNTER — Emergency Department
Admission: EM | Admit: 2024-05-12 | Discharge: 2024-05-13 | Disposition: A | Discharge status: Home / Self Care | Attending: Emergency Medicine | Admitting: Emergency Medicine

## 2024-05-12 ENCOUNTER — Emergency Department

## 2024-05-12 ENCOUNTER — Encounter: Payer: Self-pay | Admitting: *Deleted

## 2024-05-12 ENCOUNTER — Other Ambulatory Visit: Payer: Self-pay

## 2024-05-12 DIAGNOSIS — R0789 Other chest pain: Secondary | ICD-10-CM | POA: Diagnosis present

## 2024-05-12 DIAGNOSIS — K859 Acute pancreatitis without necrosis or infection, unspecified: Secondary | ICD-10-CM | POA: Diagnosis not present

## 2024-05-12 LAB — BASIC METABOLIC PANEL WITH GFR
Anion gap: 12 (ref 5–15)
BUN: 12 mg/dL (ref 6–20)
CO2: 22 mmol/L (ref 22–32)
Calcium: 9.1 mg/dL (ref 8.9–10.3)
Chloride: 104 mmol/L (ref 98–111)
Creatinine, Ser: 0.89 mg/dL (ref 0.61–1.24)
GFR, Estimated: >60 mL/min (ref >60)
Glucose, Bld: 94 mg/dL (ref 70–99)
Potassium: 3.8 mmol/L (ref 3.5–5.1)
Sodium: 138 mmol/L (ref 135–145)

## 2024-05-12 LAB — CBC
HCT: 45.2 % (ref 39.0–52.0)
Hemoglobin: 15.8 g/dL (ref 13.0–17.0)
MCH: 29.9 pg (ref 26.0–34.0)
MCHC: 35 g/dL (ref 30.0–36.0)
MCV: 85.4 fL (ref 80.0–100.0)
Platelets: 304 K/uL (ref 150–400)
RBC: 5.29 MIL/uL (ref 4.22–5.81)
RDW: 11.3 % — ABNORMAL LOW (ref 11.5–15.5)
WBC: 7.3 K/uL (ref 4.0–10.5)
nRBC: 0 % (ref 0.0–0.2)

## 2024-05-12 LAB — HEPATIC FUNCTION PANEL
ALT: 46 U/L — ABNORMAL HIGH (ref 0–44)
AST: 27 U/L (ref 15–41)
Albumin: 4.6 g/dL (ref 3.5–5.0)
Alkaline Phosphatase: 82 U/L (ref 38–126)
Bilirubin, Direct: 0.2 mg/dL (ref 0.0–0.2)
Indirect Bilirubin: 0.3 mg/dL (ref 0.3–0.9)
Total Bilirubin: 0.5 mg/dL (ref 0.0–1.2)
Total Protein: 7.4 g/dL (ref 6.5–8.1)

## 2024-05-12 LAB — LIPASE, BLOOD: Lipase: 223 U/L — ABNORMAL HIGH (ref 11–51)

## 2024-05-12 LAB — TROPONIN T, HIGH SENSITIVITY: Troponin T High Sensitivity: <15 ng/L (ref 0–19)

## 2024-05-12 NOTE — ED Provider Notes (Signed)
 [facility] Provider Note    Event Date/Time   First MD Initiated Contact with Patient 05/12/24 2300     (approximate)   History   Chest Pain   HPI  Ryan Roberts is a 33 y.o. male   Past medical history of no significant past medical history presents to the Emergency Department with intermittent chest pain over the last several days.  No distinct pattern to the pain, inciting factors, or alleviating factors.  Can happen at rest or with movement or exertion.  Nonradiating pain.  No associated shortness of breath or respiratory infectious symptoms.  No associated nausea or vomiting.  No other acute medical complaints.  No significant alcohol or drug use.  No significant family history of cardiac disease.   External Medical Documents Reviewed: Prior outpatient notes      Physical Exam   Triage Vital Signs: ED Triage Vitals  Encounter Vitals Group     BP 05/12/24 2000 (!) 152/106     Girls Systolic BP Percentile --      Girls Diastolic BP Percentile --      Boys Systolic BP Percentile --      Boys Diastolic BP Percentile --      Pulse Rate 05/12/24 2000 78     Resp 05/12/24 2000 18     Temp 05/12/24 2000 98.4 F (36.9 C)     Temp Source 05/12/24 2000 Oral     SpO2 05/12/24 2000 100 %     Weight 05/12/24 2003 235 lb (106.6 kg)     Height 05/12/24 2003 6' 2 (1.88 m)     Head Circumference --      Peak Flow --      Pain Score 05/12/24 2003 5     Pain Loc --      Pain Education --      Exclude from Growth Chart --     Most recent vital signs: Vitals:   05/12/24 2000  BP: (!) 152/106  Pulse: 78  Resp: 18  Temp: 98.4 F (36.9 C)  SpO2: 100%    General: Awake, no distress.  CV:  Good peripheral perfusion.  Resp:  Normal effort.  Abd:  No distention.  Other:  Well-appearing young man in no acute distress.  Mild epigastric pain on palpation.  Nonperitoneal exam.  Clear lungs to auscultation all fields, no heart murmurs.  Skin  appears warm well-perfused.   ED Results / Procedures / Treatments   Labs (all labs ordered are listed, but only abnormal results are displayed) Labs Reviewed  CBC - Abnormal; Notable for the following components:      Result Value   RDW 11.3 (*)    All other components within normal limits  HEPATIC FUNCTION PANEL - Abnormal; Notable for the following components:   ALT 46 (*)    All other components within normal limits  LIPASE, BLOOD - Abnormal; Notable for the following components:   Lipase 223 (*)    All other components within normal limits  BASIC METABOLIC PANEL WITH GFR  TROPONIN T, HIGH SENSITIVITY     I ordered and reviewed the above labs they are notable for lipase is 223, otherwise liver enzymes and cell counts and electrolytes unremarkable.  Troponin normal.  EKG  ED ECG REPORT I, Ginnie Shams, the attending physician, personally viewed and interpreted this ECG.   Date: 05/12/2024  EKG Time: 1958  Rate: 82  Rhythm: sinus  Axis: nl  Intervals:nl  ST&T  Change: no stemi    RADIOLOGY I independently reviewed and interpreted chest x-ray and I see no obvious focality or pneumothorax I also reviewed radiologist's formal read.   PROCEDURES:  Critical Care performed: No  Procedures   MEDICATIONS ORDERED IN ED: Medications  sodium chloride 0.9 % bolus 1,000 mL (0 mLs Intravenous Stopped 05/13/24 0119)  ketorolac  (TORADOL ) 15 MG/ML injection 15 mg (15 mg Intravenous Given 05/13/24 0021)  iohexol (OMNIPAQUE) 350 MG/ML injection 100 mL (100 mLs Intravenous Contrast Given 05/13/24 0032)     IMPRESSION / MDM / ASSESSMENT AND PLAN / ED COURSE  I reviewed the triage vital signs and the nursing notes.                                Patient's presentation is most consistent with acute presentation with potential threat to life or bodily function.  Differential diagnosis includes, but is not limited to, ACS, PE, dissection, biliary or pancreatic pathologies,  musculoskeletal pain, respiratory infection, pneumothorax   The patient is on the cardiac monitor to evaluate for evidence of arrhythmia and/or significant heart rate changes.  MDM:    Well-appearing young man with intermittent chest pain for the last several days with an elevated lipase suspicious for pancreatitis especially with epigastric tenderness.  Considered ACS but unlikely given low cardiac risk factors, normal-appearing EKG, and normal troponin, will defer second troponin given low clinical suspicion and duration of symptoms should have mounted a troponin if cardiac ischemia at this time.  Not consistent with dissection or PE.  LFTs normal, will check CT of the abdomen pelvis for any complicating pancreatitis features or concomitant gallstones to help with disposition and follow-up planning.  Given fluids, anti-inflammatory pain medications.  I considered hospitalization for admission or observation however given pain well-controlled, and CT scan with no complicating factors, I think outpatient treatment and PMD follow-up is appropriate this time.  Strict return precautions were given to the patient.        FINAL CLINICAL IMPRESSION(S) / ED DIAGNOSES   Final diagnoses:  Acute pancreatitis, unspecified complication status, unspecified pancreatitis type     Rx / DC Orders   ED Discharge Orders     None        Note:  This document was prepared using Dragon voice recognition software and may include unintentional dictation errors.    Cyrena Mylar, MD 05/13/24 [phone]

## 2024-05-12 NOTE — ED Triage Notes (Signed)
 Pt ambulatory to triage.  Pt reports intermittent chest pain for 2 days.  No sob.  No n/v  pt reports dizziness.  Pt alert  speech clear.

## 2024-05-13 ENCOUNTER — Emergency Department

## 2024-05-13 MED ORDER — SODIUM CHLORIDE 0.9 % IV BOLUS
1000.0000 mL | Freq: Once | INTRAVENOUS | Status: AC
  Administered 2024-05-13: 1000 mL via INTRAVENOUS

## 2024-05-13 MED ORDER — KETOROLAC TROMETHAMINE 15 MG/ML IJ SOLN
15.0000 mg | Freq: Once | INTRAMUSCULAR | Status: AC
  Administered 2024-05-13: 15 mg via INTRAVENOUS
  Filled 2024-05-13: qty 1

## 2024-05-13 MED ORDER — IOHEXOL 350 MG/ML SOLN
100.0000 mL | Freq: Once | INTRAVENOUS | Status: AC | PRN
  Administered 2024-05-13: 100 mL via INTRAVENOUS

## 2024-05-13 NOTE — Discharge Instructions (Addendum)
 Your CT scan did not show any other abnormalities associated with your pancreas inflammation   Please follow the pancreatitis eating plan attached to these documents.  Take acetaminophen  650 mg and ibuprofen 400 mg every 6 hours for pain.  Take with food.  See your primary doctor for follow-up appointment.

## 2024-06-09 ENCOUNTER — Other Ambulatory Visit: Payer: Self-pay

## 2024-06-09 DIAGNOSIS — R1011 Right upper quadrant pain: Secondary | ICD-10-CM

## 2024-06-14 ENCOUNTER — Ambulatory Visit: Admission: RE | Admit: 2024-06-14 | Discharge: 2024-06-14 | Disposition: A | Source: Ambulatory Visit

## 2024-06-14 DIAGNOSIS — R1011 Right upper quadrant pain: Secondary | ICD-10-CM | POA: Diagnosis present

## 2024-08-01 ENCOUNTER — Other Ambulatory Visit: Payer: Self-pay | Admitting: Nurse Practitioner

## 2024-08-01 DIAGNOSIS — I10 Essential (primary) hypertension: Secondary | ICD-10-CM

## 2024-08-03 NOTE — Telephone Encounter (Signed)
 Requested medications are due for refill today.  yes  Requested medications are on the active medications list.  yes  Last refill. 05/06/2024 #30 0 rf  Future visit scheduled.   no  Notes to clinic.  Pt over due for an ov. Courtesy refill already given.    Requested Prescriptions  Pending Prescriptions Disp Refills   amLODipine  (NORVASC ) 5 MG tablet [Pharmacy Med Name: AMLODIPINE  BESYLATE 5 MG TAB] 90 tablet 1    Sig: TAKE 1 TABLET BY MOUTH EVERY DAY     Cardiovascular: Calcium Channel Blockers 2 Failed - 08/03/2024 10:57 AM      Failed - Last BP in normal range    BP Readings from Last 1 Encounters:  05/12/24 (!) 152/106         Failed - Valid encounter within last 6 months    Recent Outpatient Visits           11 months ago Primary hypertension   [facility] Gareth Mliss [place], [place]              Passed - Last Heart Rate in normal range    Pulse Readings from Last 1 Encounters:  05/12/24 78
